# Patient Record
Sex: Female | Born: 1940 | Race: White | Hispanic: No | Marital: Married | Smoking: Never smoker
Health system: Southern US, Community
[De-identification: ages and names within clinical notes are randomized; demographics above are authoritative.]

## PROBLEM LIST (undated history)

## (undated) DIAGNOSIS — C50919 Malignant neoplasm of unspecified site of unspecified female breast: Secondary | ICD-10-CM

## (undated) DIAGNOSIS — I10 Essential (primary) hypertension: Secondary | ICD-10-CM

## (undated) DIAGNOSIS — E119 Type 2 diabetes mellitus without complications: Secondary | ICD-10-CM

## (undated) DIAGNOSIS — I2699 Other pulmonary embolism without acute cor pulmonale: Secondary | ICD-10-CM

## (undated) DIAGNOSIS — E785 Hyperlipidemia, unspecified: Secondary | ICD-10-CM

## (undated) HISTORY — PX: ABDOMINAL HYSTERECTOMY: SHX81

## (undated) HISTORY — PX: BREAST LUMPECTOMY: SHX2

## (undated) HISTORY — PX: TONSILLECTOMY: SUR1361

## (undated) HISTORY — PX: BACK SURGERY: SHX140

---

## 1999-11-29 ENCOUNTER — Other Ambulatory Visit: Admission: RE | Admit: 1999-11-29 | Discharge: 1999-11-29 | Payer: Self-pay | Admitting: Obstetrics and Gynecology

## 2001-01-21 ENCOUNTER — Other Ambulatory Visit: Admission: RE | Admit: 2001-01-21 | Discharge: 2001-01-21 | Payer: Self-pay | Admitting: Obstetrics and Gynecology

## 2001-02-26 ENCOUNTER — Encounter: Payer: Self-pay | Admitting: Orthopedic Surgery

## 2001-03-04 ENCOUNTER — Ambulatory Visit (HOSPITAL_COMMUNITY): Admission: RE | Admit: 2001-03-04 | Discharge: 2001-03-04 | Payer: Self-pay | Admitting: Orthopedic Surgery

## 2002-03-17 ENCOUNTER — Other Ambulatory Visit: Admission: RE | Admit: 2002-03-17 | Discharge: 2002-03-17 | Payer: Self-pay | Admitting: Obstetrics and Gynecology

## 2005-01-13 ENCOUNTER — Encounter: Admission: RE | Admit: 2005-01-13 | Discharge: 2005-01-13 | Payer: Self-pay | Admitting: General Surgery

## 2005-01-20 ENCOUNTER — Encounter: Admission: RE | Admit: 2005-01-20 | Discharge: 2005-01-20 | Payer: Self-pay | Admitting: General Surgery

## 2005-01-22 ENCOUNTER — Ambulatory Visit (HOSPITAL_BASED_OUTPATIENT_CLINIC_OR_DEPARTMENT_OTHER): Admission: RE | Admit: 2005-01-22 | Discharge: 2005-01-22 | Payer: Self-pay | Admitting: General Surgery

## 2005-01-22 ENCOUNTER — Ambulatory Visit (HOSPITAL_COMMUNITY): Admission: RE | Admit: 2005-01-22 | Discharge: 2005-01-22 | Payer: Self-pay | Admitting: General Surgery

## 2005-01-22 ENCOUNTER — Encounter (INDEPENDENT_AMBULATORY_CARE_PROVIDER_SITE_OTHER): Payer: Self-pay | Admitting: *Deleted

## 2005-01-29 ENCOUNTER — Ambulatory Visit: Payer: Self-pay | Admitting: Oncology

## 2005-02-25 ENCOUNTER — Encounter: Admission: RE | Admit: 2005-02-25 | Discharge: 2005-02-25 | Payer: Self-pay | Admitting: General Surgery

## 2005-03-06 ENCOUNTER — Ambulatory Visit: Admission: RE | Admit: 2005-03-06 | Discharge: 2005-05-16 | Payer: Self-pay | Admitting: Radiation Oncology

## 2005-04-29 ENCOUNTER — Ambulatory Visit: Payer: Self-pay | Admitting: Oncology

## 2005-08-12 ENCOUNTER — Ambulatory Visit: Payer: Self-pay | Admitting: Oncology

## 2005-11-10 ENCOUNTER — Ambulatory Visit: Payer: Self-pay | Admitting: Oncology

## 2006-08-21 ENCOUNTER — Ambulatory Visit: Payer: Self-pay | Admitting: Oncology

## 2006-08-25 LAB — COMPREHENSIVE METABOLIC PANEL
ALT: 13 U/L (ref 0–40)
AST: 16 U/L (ref 0–37)
Albumin: 4.2 g/dL (ref 3.5–5.2)
CO2: 30 mEq/L (ref 19–32)
Calcium: 9.4 mg/dL (ref 8.4–10.5)
Chloride: 103 mEq/L (ref 96–112)
Potassium: 3.7 mEq/L (ref 3.5–5.3)
Total Protein: 6.6 g/dL (ref 6.0–8.3)

## 2006-08-25 LAB — CBC WITH DIFFERENTIAL/PLATELET
BASO%: 0.5 % (ref 0.0–2.0)
Eosinophils Absolute: 0.1 10*3/uL (ref 0.0–0.5)
HCT: 43 % (ref 34.8–46.6)
MCHC: 34.5 g/dL (ref 32.0–36.0)
MONO#: 0.4 10*3/uL (ref 0.1–0.9)
NEUT#: 3.6 10*3/uL (ref 1.5–6.5)
RBC: 4.85 10*6/uL (ref 3.70–5.32)
WBC: 5.2 10*3/uL (ref 3.9–10.0)
lymph#: 1.1 10*3/uL (ref 0.9–3.3)

## 2006-08-25 LAB — CANCER ANTIGEN 27.29: CA 27.29: 16 U/mL (ref 0–39)

## 2007-07-09 ENCOUNTER — Ambulatory Visit: Payer: Self-pay | Admitting: Oncology

## 2007-09-10 ENCOUNTER — Ambulatory Visit: Payer: Self-pay | Admitting: Oncology

## 2007-09-14 LAB — CBC WITH DIFFERENTIAL/PLATELET
BASO%: 0.4 % (ref 0.0–2.0)
EOS%: 1 % (ref 0.0–7.0)
MCH: 30.3 pg (ref 26.0–34.0)
MCHC: 35.1 g/dL (ref 32.0–36.0)
NEUT%: 62.8 % (ref 39.6–76.8)
RBC: 4.94 10*6/uL (ref 3.70–5.32)
RDW: 13.4 % (ref 11.3–14.5)
lymph#: 1.5 10*3/uL (ref 0.9–3.3)

## 2007-09-14 LAB — COMPREHENSIVE METABOLIC PANEL
ALT: 19 U/L (ref 0–35)
AST: 18 U/L (ref 0–37)
Calcium: 9.9 mg/dL (ref 8.4–10.5)
Chloride: 102 mEq/L (ref 96–112)
Creatinine, Ser: 0.85 mg/dL (ref 0.40–1.20)
Potassium: 4.4 mEq/L (ref 3.5–5.3)
Sodium: 143 mEq/L (ref 135–145)

## 2008-04-26 ENCOUNTER — Encounter: Admission: RE | Admit: 2008-04-26 | Discharge: 2008-04-26 | Payer: Self-pay | Admitting: General Surgery

## 2008-06-13 ENCOUNTER — Inpatient Hospital Stay (HOSPITAL_COMMUNITY): Admission: RE | Admit: 2008-06-13 | Discharge: 2008-06-17 | Payer: Self-pay | Admitting: Neurosurgery

## 2008-09-06 ENCOUNTER — Encounter: Admission: RE | Admit: 2008-09-06 | Discharge: 2008-09-06 | Payer: Self-pay | Admitting: Neurosurgery

## 2008-09-15 ENCOUNTER — Ambulatory Visit: Payer: Self-pay | Admitting: Oncology

## 2008-09-19 LAB — COMPREHENSIVE METABOLIC PANEL
Albumin: 3.8 g/dL (ref 3.5–5.2)
Alkaline Phosphatase: 72 U/L (ref 39–117)
BUN: 12 mg/dL (ref 6–23)
CO2: 31 mEq/L (ref 19–32)
Glucose, Bld: 120 mg/dL — ABNORMAL HIGH (ref 70–99)
Sodium: 142 mEq/L (ref 135–145)
Total Bilirubin: 0.5 mg/dL (ref 0.3–1.2)
Total Protein: 7 g/dL (ref 6.0–8.3)

## 2008-09-19 LAB — CBC WITH DIFFERENTIAL/PLATELET
Basophils Absolute: 0 10*3/uL (ref 0.0–0.1)
Eosinophils Absolute: 0.1 10*3/uL (ref 0.0–0.5)
HCT: 43.6 % (ref 34.8–46.6)
HGB: 14.9 g/dL (ref 11.6–15.9)
LYMPH%: 16.8 % (ref 14.0–48.0)
MCV: 86.7 fL (ref 81.0–101.0)
MONO#: 0.5 10*3/uL (ref 0.1–0.9)
MONO%: 7.4 % (ref 0.0–13.0)
NEUT#: 4.9 10*3/uL (ref 1.5–6.5)
NEUT%: 74.5 % (ref 39.6–76.8)
Platelets: 233 10*3/uL (ref 145–400)
RBC: 5.03 10*6/uL (ref 3.70–5.32)
WBC: 6.6 10*3/uL (ref 3.9–10.0)

## 2008-09-19 LAB — CANCER ANTIGEN 27.29: CA 27.29: 25 U/mL (ref 0–39)

## 2009-02-08 ENCOUNTER — Ambulatory Visit: Payer: Self-pay | Admitting: Radiology

## 2009-02-08 ENCOUNTER — Ambulatory Visit (HOSPITAL_BASED_OUTPATIENT_CLINIC_OR_DEPARTMENT_OTHER): Admission: RE | Admit: 2009-02-08 | Discharge: 2009-02-08 | Payer: Self-pay | Admitting: Family Medicine

## 2009-06-28 ENCOUNTER — Encounter: Admission: RE | Admit: 2009-06-28 | Discharge: 2009-06-28 | Payer: Self-pay | Admitting: Neurosurgery

## 2009-09-10 ENCOUNTER — Ambulatory Visit: Payer: Self-pay | Admitting: Oncology

## 2009-09-12 LAB — COMPREHENSIVE METABOLIC PANEL
Alkaline Phosphatase: 84 U/L (ref 39–117)
BUN: 18 mg/dL (ref 6–23)
CO2: 28 mEq/L (ref 19–32)
Creatinine, Ser: 1 mg/dL (ref 0.40–1.20)
Glucose, Bld: 71 mg/dL (ref 70–99)
Sodium: 140 mEq/L (ref 135–145)
Total Bilirubin: 0.3 mg/dL (ref 0.3–1.2)

## 2009-09-12 LAB — CBC WITH DIFFERENTIAL/PLATELET
Eosinophils Absolute: 0.1 10*3/uL (ref 0.0–0.5)
HCT: 43.6 % (ref 34.8–46.6)
LYMPH%: 20 % (ref 14.0–49.7)
MCV: 88.9 fL (ref 79.5–101.0)
MONO#: 0.6 10*3/uL (ref 0.1–0.9)
MONO%: 9.6 % (ref 0.0–14.0)
NEUT#: 4 10*3/uL (ref 1.5–6.5)
NEUT%: 68.1 % (ref 38.4–76.8)
Platelets: 214 10*3/uL (ref 145–400)
RBC: 4.91 10*6/uL (ref 3.70–5.45)
WBC: 5.9 10*3/uL (ref 3.9–10.3)

## 2010-10-01 ENCOUNTER — Ambulatory Visit: Payer: Self-pay | Admitting: Oncology

## 2010-10-03 LAB — CBC WITH DIFFERENTIAL/PLATELET
EOS%: 4.4 % (ref 0.0–7.0)
LYMPH%: 22.9 % (ref 14.0–49.7)
MCH: 29.9 pg (ref 25.1–34.0)
MCHC: 34.1 g/dL (ref 31.5–36.0)
MCV: 87.6 fL (ref 79.5–101.0)
MONO%: 8.6 % (ref 0.0–14.0)
Platelets: 240 10*3/uL (ref 145–400)
RBC: 4.95 10*6/uL (ref 3.70–5.45)
RDW: 14.1 % (ref 11.2–14.5)

## 2010-10-04 LAB — COMPREHENSIVE METABOLIC PANEL
AST: 21 U/L (ref 0–37)
Albumin: 4 g/dL (ref 3.5–5.2)
Alkaline Phosphatase: 86 U/L (ref 39–117)
BUN: 18 mg/dL (ref 6–23)
Potassium: 4.2 mEq/L (ref 3.5–5.3)
Sodium: 138 mEq/L (ref 135–145)
Total Bilirubin: 0.5 mg/dL (ref 0.3–1.2)
Total Protein: 6.9 g/dL (ref 6.0–8.3)

## 2011-03-18 NOTE — Op Note (Signed)
NAME:  Denise Salazar, Denise Salazar NO.:  [id number]   MEDICAL RECORD NO.:  [id number]          PATIENT TYPE:  INP   LOCATION:  3028                         FACILITY:  [facility]   PHYSICIAN:  Reinaldo Meeker, M.D. DATE OF BIRTH:  1941-10-30   DATE OF PROCEDURE:  DATE OF DISCHARGE:                               OPERATIVE REPORT   PREOPERATIVE DIAGNOSIS:  Herniated disk L4-5 central and bilateral.   POSTOPERATIVE DIAGNOSIS:  Herniated disk L4-5 central and bilateral.   PROCEDURE:  Bilateral L4-5 interlaminar laminotomy for excision of  herniated disk with operative microscope followed by interspinous fusion  with invasive bony spacer followed by nonsegmental instrumentation L4-5  with the spinous process clamp.   SURGEON:  Reinaldo Meeker, MD   ASSISTANT:  Kathaleen Maser. Pool, MD   PROCEDURE IN DETAIL:  After being placed in the prone position, the  patient's back was prepped and draped in the usual sterile fashion.  Localizing x-rays were taken prior to incision to identify the  appropriate level.  Midline incision was made over the spinous process  of L4-L5.  Using Bovie cutting current, the incision was carried on the  spinous processes.  Subperiosteal dissection was then carried out  bilaterally on the spinous processes and lamina.  Self-retaining  retractor was placed for exposure.  X-ray showed approach to the  appropriate level.  Starting on the patient's left side, laminotomy was  performed by removing the inferior one-half of the L4 lamina, medial one-  third of the facet joint, the superior one-half of L5 lamina.  Residual  bone was removed and saved for use later in the case.  Ligamentum flavum  was removed in a piecemeal fashion.  Similar procedure was then carried  out on the patient's right side.  Once again, we removed the inferior  half of the L4 lamina, the medial one-third of the facet joint, and the  superior one-half of L5 lamina.  Once again, residual bone was  removed  and saved for use later in the case.  Ligamentum flavum was removed in a  piecemeal fashion.  Interspinous ligament and ligamentum flavum between  the spinous processes of L4-L5 was also removed in preparation for the  interspinous fusion.  This time, microscope was draped, brought into the  field, and used for the remainder of the case.  Starting on the  patient's left side, microdiskectomy was carried out.  Large amounts of  herniated disk material including 2 large free fragments were identified  and removed.  This gave excellent decompression of the underlying dura  and nerve root.  Similar diskectomy was then carried on the opposite  side until the disk space was well cleaned out bilaterally.  At this  time, posterior fusion was performed by measuring the template up to a  29-FA size.  A 14-mm bony spacer was placed between the spinous  processes of L4 and L5 and felt to be in good position.  A mixture of  OsteoSet Plus and autologous bone was then placed on top of that and  then a 45-mm interspinous clamp was placed without difficulty  for  posterior instrumentation.  This time, large amounts of irrigation was  carried out.  More OsteoSet autologous bone were placed in between the  clamp overlying the spinous processes of L4-L5.  Final fluoroscopy in AP  and lateral direction showed everything to be in excellent position.  Hemovac drain was then left in the epidural space  and brought through a separate stab incision.  The wound was then closed  in multiple layers of Vicryl in the muscle fascia, subcutaneous, and  subcuticular tissues, and staples were placed on the skin.  A sterile  dressing was then applied.  The patient was extubated and taken to  recovery room in stable condition.           ______________________________  Reinaldo Meeker, M.D.     ROK/MEDQ  D:  06/16/2008  T:  06/17/2008  Job:  [id number]

## 2011-03-21 NOTE — Discharge Summary (Signed)
NAME:  KIEREN, RICCI NO.:  [id number]   MEDICAL RECORD NO.:  [id number]          PATIENT TYPE:  INP   LOCATION:  3028                         FACILITY:  [facility]   PHYSICIAN:  Reinaldo Meeker, M.D. DATE OF BIRTH:  10/30/1941   DATE OF ADMISSION:  06/13/2008  DATE OF DISCHARGE:  06/17/2008                               DISCHARGE SUMMARY   PRIMARY DIAGNOSIS:  Herniated disc at L4-L5.   SECONDARY DIAGNOSES:  History of deep vein thrombosis with pulmonary  embolism.   PROCEDURE:  L4-L5 diskectomy with interspinous fusion and spinous  process plating.   HISTORY:  Ms. Rhee is a 70 year old female with a large central  herniated disk, L4-L5 requiring surgery.  She had a DVT and a pulmonary  embolism in May of this year and is on Coumadin.  Coumadin was stopped 4  days prior to admission.  She was admitted for heparinization until the  time of her surgery.   HOSPITAL COURSE:  Ms. Camposano was admitted on June 13, 2008, and was  started on heparin.  This was kept going until midnight before her  surgery on June 16, 2008.  On June 16, 2008, she was taken to the  operating room, and she underwent the bilateral L4-L5 diskectomy with  interspinous fusion and interspinous plate.  She did extremely well with  surgery and worked with marked relief of her leg pain.  By the next day,  she was up ambulating well tolerating regular diet.  The plan was to  restart her Coumadin in 2 days' time.  Her condition was markedly  improved versus admission.  She was discharged to home with pain  medications only.           ______________________________  Reinaldo Meeker, M.D.     ROK/MEDQ  D:  07/27/2008  T:  07/28/2008  Job:  [id number]

## 2011-08-01 LAB — CBC
HCT: 39.9
HCT: 40.4
Hemoglobin: 13.7
Hemoglobin: 13.7
Hemoglobin: 13.8
MCHC: 34
MCHC: 34.1
MCHC: 34.3
MCV: 87.4
Platelets: 221
Platelets: 238
RDW: 14.4
RDW: 14.5
RDW: 14.7
RDW: 14.7
WBC: 5.6

## 2011-08-01 LAB — APTT: aPTT: 28

## 2011-08-01 LAB — HEPARIN LEVEL (UNFRACTIONATED)
Heparin Unfractionated: 0.48
Heparin Unfractionated: 0.77 — ABNORMAL HIGH
Heparin Unfractionated: 0.91 — ABNORMAL HIGH
Heparin Unfractionated: <0.1 — ABNORMAL LOW

## 2011-08-01 LAB — PROTIME-INR
INR: 1
Prothrombin Time: 13.2

## 2011-08-01 LAB — BASIC METABOLIC PANEL
CO2: 29
Calcium: 8.8
Glucose, Bld: 138 — ABNORMAL HIGH
Sodium: 140

## 2014-03-06 ENCOUNTER — Telehealth (INDEPENDENT_AMBULATORY_CARE_PROVIDER_SITE_OTHER): Payer: Self-pay

## 2014-03-06 NOTE — Telephone Encounter (Signed)
I returned pts call. Pt states she has cyst on her finger and was not sure if our office did this type of procedure. Pt advised she needs to see a Copy. Pt states she has ortho hand specialist with the ortho md she sees. Pt states she will call their office for appt.

## 2014-03-06 NOTE — Telephone Encounter (Signed)
Message copied by Dois Davenport on Mon Mar 06, 2014  3:51 PM ------      Message from: Joya San      Created: Mon Mar 06, 2014  3:35 PM      Contact: [phone]                   ----- Message -----         From: Jeanann Lewandowsky, CMA         Sent: 03/06/2014   3:27 PM           To: Joya San            Please send to [facility]!      ----- Message -----         From: Joya San         Sent: 03/06/2014   1:54 PM           To: Jeanann Lewandowsky, CMA            Pt has a question concerning a cyst she found. Please advise       ------

## 2021-03-01 ENCOUNTER — Other Ambulatory Visit: Payer: Self-pay

## 2021-03-01 ENCOUNTER — Encounter: Payer: Self-pay | Admitting: Podiatry

## 2021-03-01 ENCOUNTER — Ambulatory Visit (INDEPENDENT_AMBULATORY_CARE_PROVIDER_SITE_OTHER): Payer: MEDICARE | Admitting: Podiatry

## 2021-03-01 DIAGNOSIS — B351 Tinea unguium: Secondary | ICD-10-CM | POA: Diagnosis not present

## 2021-03-01 DIAGNOSIS — M79675 Pain in left toe(s): Secondary | ICD-10-CM | POA: Diagnosis not present

## 2021-03-01 DIAGNOSIS — D689 Coagulation defect, unspecified: Secondary | ICD-10-CM | POA: Insufficient documentation

## 2021-03-01 DIAGNOSIS — E1159 Type 2 diabetes mellitus with other circulatory complications: Secondary | ICD-10-CM | POA: Diagnosis not present

## 2021-03-01 DIAGNOSIS — E119 Type 2 diabetes mellitus without complications: Secondary | ICD-10-CM | POA: Diagnosis not present

## 2021-03-01 DIAGNOSIS — M79674 Pain in right toe(s): Secondary | ICD-10-CM | POA: Diagnosis not present

## 2021-03-01 NOTE — Progress Notes (Signed)
This patient presents to the office with chief complaint of long thick nails and diabetic feet.  This patient  says there  is  no pain and discomfort in their feet.  This patient says there are long thick painful nails.  These nails are painful walking and wearing shoes.  Patient has no history of infection or drainage from both feet.  Patient is unable to  self treat his own nails . This patient presents  to the office today for treatment of the  long nails and a foot evaluation due to history of  diabetes.  General Appearance  Alert, conversant and in no acute stress.  Vascular  Dorsalis pedis and posterior tibial  pulses are not  palpable  bilaterally.  Capillary return is within normal limits  bilaterally. Temperature is within normal limits  bilaterally.  Neurologic  Senn-Weinstein monofilament wire test within normal limits  bilaterally. Muscle power within normal limits bilaterally.  Nails Thick disfigured discolored nails with subungual debris  from hallux to fifth toes bilaterally. No evidence of bacterial infection or drainage bilaterally.  Orthopedic  No limitations of motion of motion feet .  No crepitus or effusions noted.  No bony pathology or digital deformities noted.  Skin  normotropic skin with no porokeratosis noted bilaterally.  No signs of infections or ulcers noted.     Onychomycosis  Diabetes with vascular pathology.  IE  Debride nails x 10.  A diabetic foot exam was performed and there is no evidence of   neurologic pathology but she has vascular pathology.Marland Kitchen   RTC 3 months.   Gardiner Barefoot DPM

## 2021-05-29 ENCOUNTER — Ambulatory Visit (INDEPENDENT_AMBULATORY_CARE_PROVIDER_SITE_OTHER): Payer: MEDICARE | Admitting: Podiatry

## 2021-05-29 ENCOUNTER — Encounter: Payer: Self-pay | Admitting: Podiatry

## 2021-05-29 ENCOUNTER — Other Ambulatory Visit: Payer: Self-pay

## 2021-05-29 DIAGNOSIS — D689 Coagulation defect, unspecified: Secondary | ICD-10-CM

## 2021-05-29 DIAGNOSIS — B351 Tinea unguium: Secondary | ICD-10-CM | POA: Diagnosis not present

## 2021-05-29 DIAGNOSIS — M79675 Pain in left toe(s): Secondary | ICD-10-CM | POA: Diagnosis not present

## 2021-05-29 DIAGNOSIS — M79674 Pain in right toe(s): Secondary | ICD-10-CM

## 2021-05-29 DIAGNOSIS — E1159 Type 2 diabetes mellitus with other circulatory complications: Secondary | ICD-10-CM

## 2021-05-29 NOTE — Progress Notes (Signed)
This patient returns to my office for at risk foot care.  This patient requires this care by a professional since this patient will be at risk due to having diabetes and coagulation defect.    This patient is unable to cut nails herself since the patient cannot reach her nails.These nails are painful walking and wearing shoes.  This patient presents for at risk foot care today.  General Appearance  Alert, conversant and in no acute stress.  Vascular  Dorsalis pedis and posterior tibial  pulses are  not palpable  bilaterally.  Capillary return is within normal limits  bilaterally. Temperature is within normal limits  bilaterally.  Neurologic  Senn-Weinstein monofilament wire test within normal limits  bilaterally. Muscle power within normal limits bilaterally.  Nails Thick disfigured discolored nails with subungual debris  from hallux to fifth toes bilaterally. No evidence of bacterial infection or drainage bilaterally.  Orthopedic  No limitations of motion  feet .  No crepitus or effusions noted.  No bony pathology or digital deformities noted.  Skin  normotropic skin with no porokeratosis noted bilaterally.  No signs of infections or ulcers noted.     Onychomycosis  Pain in right toes  Pain in left toes  Consent was obtained for treatment procedures.   Mechanical debridement of nails 1-5  bilaterally performed with a nail nipper.  Filed with dremel without incident.    Return office visit    3 months                  Told patient to return for periodic foot care and evaluation due to potential at risk complications.   Gardiner Barefoot DPM

## 2021-07-12 ENCOUNTER — Emergency Department (HOSPITAL_BASED_OUTPATIENT_CLINIC_OR_DEPARTMENT_OTHER): Payer: MEDICARE

## 2021-07-12 ENCOUNTER — Emergency Department (HOSPITAL_BASED_OUTPATIENT_CLINIC_OR_DEPARTMENT_OTHER)
Admission: EM | Admit: 2021-07-12 | Discharge: 2021-07-12 | Disposition: A | Discharge status: Home / Self Care | Payer: MEDICARE | Attending: Emergency Medicine | Admitting: Emergency Medicine

## 2021-07-12 ENCOUNTER — Other Ambulatory Visit: Payer: Self-pay

## 2021-07-12 ENCOUNTER — Encounter (HOSPITAL_BASED_OUTPATIENT_CLINIC_OR_DEPARTMENT_OTHER): Payer: Self-pay | Admitting: *Deleted

## 2021-07-12 DIAGNOSIS — Z79899 Other long term (current) drug therapy: Secondary | ICD-10-CM | POA: Insufficient documentation

## 2021-07-12 DIAGNOSIS — I10 Essential (primary) hypertension: Secondary | ICD-10-CM | POA: Diagnosis not present

## 2021-07-12 DIAGNOSIS — Z853 Personal history of malignant neoplasm of breast: Secondary | ICD-10-CM | POA: Insufficient documentation

## 2021-07-12 DIAGNOSIS — R5383 Other fatigue: Secondary | ICD-10-CM | POA: Insufficient documentation

## 2021-07-12 DIAGNOSIS — R2243 Localized swelling, mass and lump, lower limb, bilateral: Secondary | ICD-10-CM | POA: Diagnosis not present

## 2021-07-12 DIAGNOSIS — Z9010 Acquired absence of unspecified breast and nipple: Secondary | ICD-10-CM | POA: Insufficient documentation

## 2021-07-12 DIAGNOSIS — Z7902 Long term (current) use of antithrombotics/antiplatelets: Secondary | ICD-10-CM | POA: Diagnosis not present

## 2021-07-12 DIAGNOSIS — E119 Type 2 diabetes mellitus without complications: Secondary | ICD-10-CM | POA: Diagnosis not present

## 2021-07-12 DIAGNOSIS — R911 Solitary pulmonary nodule: Secondary | ICD-10-CM | POA: Insufficient documentation

## 2021-07-12 DIAGNOSIS — R609 Edema, unspecified: Secondary | ICD-10-CM

## 2021-07-12 DIAGNOSIS — R0602 Shortness of breath: Secondary | ICD-10-CM | POA: Diagnosis not present

## 2021-07-12 HISTORY — DX: Type 2 diabetes mellitus without complications: E11.9

## 2021-07-12 HISTORY — DX: Other pulmonary embolism without acute cor pulmonale: I26.99

## 2021-07-12 HISTORY — DX: Essential (primary) hypertension: I10

## 2021-07-12 HISTORY — DX: Malignant neoplasm of unspecified site of unspecified female breast: C50.919

## 2021-07-12 HISTORY — DX: Hyperlipidemia, unspecified: E78.5

## 2021-07-12 LAB — PROTIME-INR
INR: 1.8 — ABNORMAL HIGH (ref 0.8–1.2)
Prothrombin Time: 20.5 seconds — ABNORMAL HIGH (ref 11.4–15.2)

## 2021-07-12 MED ORDER — IOHEXOL 350 MG/ML SOLN
100.0000 mL | Freq: Once | INTRAVENOUS | Status: AC | PRN
  Administered 2021-07-12: 80 mL via INTRAVENOUS

## 2021-07-12 NOTE — ED Provider Notes (Signed)
[facility] Provider Note   CSN: [id number] Arrival date & time: 07/12/21  1508     History Chief Complaint  Patient presents with   Elevated dimer    Denise Salazar is a 80 y.o. female with a past medical history of breast cancer, hypertension, prior PE and DVT, currently on Coumadin presenting to the ED with leg swelling, shortness of breath and fatigue.  Symptoms have been going on for the past several months.  She was evaluated by her PCP for the symptoms as well as for an annual physical.  Blood work was obtained she was found to have an elevated D-dimer so she was sent to the ER for a CT angio to rule out a PE.  She last was diagnosed with PE/DVT over 10 years ago.  At that time she was under treatment for breast cancer.  She takes Coumadin on a daily basis and has not missed any doses.  Denies any hemoptysis, chest pain.  She reports bilateral lower extremity edema that improves overnight when she elevates her leg.  The left leg is slightly worse than the right.  Denies any abdominal pain, cough or fever.  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     No family history on file.  Social History   Tobacco Use   Smoking status: Never   Smokeless tobacco: Never  Substance Use Topics   Alcohol use: Not Currently   Drug use: Not Currently    Home Medications Prior to Admission medications   Medication  Sig Start Date End Date Taking? Authorizing Provider  amLODipine (NORVASC) 2.5 MG tablet Take 1 tablet by mouth daily. 06/06/21  Yes [provider]  bupivacaine (MARCAINE) 0.25 % injection Inject into the skin. 04/23/21  Yes [provider]  denosumab (PROLIA) 60 MG/ML SOSY injection Inject into the skin. 01/03/20  Yes [provider]  escitalopram (LEXAPRO) 10 MG tablet Take 1 tablet by mouth daily. 07/02/21 09/30/21 Yes [provider]  furosemide (LASIX) 20 MG tablet Take by mouth. 03/26/20  Yes [provider]  potassium chloride SA (KLOR-CON) 20 MEQ tablet TAKE 1 TABLET(20 MEQ) BY MOUTH DAILY 05/20/21  Yes [provider]  warfarin (COUMADIN) 5 MG tablet TAKE 1 TABLET BY MOUTH  DAILY EXCEPT TAKE 1 AND 1/2 TABLETS ON FRIDAYS OR AS  DIRECTED 06/26/21  Yes [provider]  calcium carbonate (OSCAL) 1500 (600 Ca) MG TABS tablet Take by mouth.    [provider]  Multiple Vitamin (MULTI-VITAMIN) tablet Take 1 tablet by mouth daily.    [provider]  pravastatin (PRAVACHOL) 10 MG tablet Take 10 mg by mouth at bedtime. 05/12/21   [provider]  Vitamin D, Ergocalciferol, (DRISDOL) 1.25 MG (50000 UNIT) CAPS capsule Take by mouth. 06/12/21  [provider]    Allergies    No allergies on file  Review of Systems   Review of Systems  Constitutional:  Positive for fatigue. Negative for appetite change, chills and fever.  HENT:  Negative for ear pain, rhinorrhea, sneezing and sore throat.   Eyes:  Negative for photophobia and visual disturbance.  Respiratory:  Positive for shortness of breath. Negative for cough, chest tightness and wheezing.   Cardiovascular:  Positive for leg swelling. Negative for chest pain and palpitations.  Gastrointestinal:  Negative for abdominal pain, blood in stool, constipation, diarrhea, nausea and vomiting.  Genitourinary:  Negative for dysuria, hematuria and urgency.   Musculoskeletal:  Negative for myalgias.  Skin:  Negative for rash.  Neurological:  Negative for dizziness, weakness and light-headedness.   Physical Exam Updated Vital Signs BP (!) 201/70   Pulse (!) 59   Temp 98.4 F (36.9 C) (Oral)   Resp 16   Ht '5\' 4"'$  (1.626 m)   Wt 86.2 kg   SpO2 98%   BMI 32.61 kg/m   Physical Exam Vitals and nursing note reviewed.  Constitutional:      General: She is not in acute distress.    Appearance: She is well-developed.  HENT:     Head: Normocephalic and atraumatic.     Nose: Nose normal.  Eyes:     General: No scleral icterus.       Left eye: No discharge.     Conjunctiva/sclera: Conjunctivae normal.  Cardiovascular:     Rate and Rhythm: Normal rate and regular rhythm.     Heart sounds: Normal heart sounds. No murmur heard.   No friction rub. No gallop.  Pulmonary:     Effort: Pulmonary effort is normal. No respiratory distress.     Breath sounds: Normal breath sounds.  Abdominal:     General: Bowel sounds are normal. There is no distension.     Palpations: Abdomen is soft.     Tenderness: There is no abdominal tenderness. There is no guarding.  Musculoskeletal:        General: Normal range of motion.     Cervical back: Normal range of motion and neck supple.     Comments: Bilateral lower extremity edema.  2+ DP pulse palpated.  No erythema or calf tenderness bilaterally.  Skin:    General: Skin is warm and dry.     Findings: No rash.  Neurological:     Mental Status: She is alert.     Motor: No abnormal muscle tone.     Coordination: Coordination normal.    ED Results / Procedures / Treatments   Labs (all labs ordered are listed, but only abnormal results are displayed) Labs Reviewed  PROTIME-INR - Abnormal; Notable for the following components:      Result Value   Prothrombin Time 20.5 (*)    INR 1.8 (*)    All other components within normal limits    EKG None  Radiology CT Angio Chest PE W/Cm &/Or Wo  Cm  Result Date: 07/12/2021 CLINICAL DATA:  Concern for pulmonary embolism. EXAM: CT ANGIOGRAPHY CHEST WITH CONTRAST TECHNIQUE: Multidetector CT imaging of the chest was performed using the standard protocol during bolus administration of intravenous contrast. Multiplanar CT image reconstructions and MIPs were obtained to evaluate the vascular anatomy. CONTRAST:  3m OMNIPAQUE IOHEXOL 350 MG/ML SOLN COMPARISON:  Chest radiograph dated 02/08/2009. FINDINGS: Cardiovascular: There is no cardiomegaly or pericardial effusion. There is coronary vascular calcification. Moderate atherosclerotic calcification of  the thoracic aorta. No aneurysmal dilatation or dissection. The origins of the great vessels of the aortic arch appear patent as visualized. Evaluation of the pulmonary arteries is somewhat limited due to respiratory motion. No pulmonary artery embolus identified. Mediastinum/Nodes: No hilar or mediastinal adenopathy. The esophagus and the thyroid gland are grossly unremarkable as visualized. No mediastinal fluid collection. Lungs/Pleura: There is a 7 mm right upper lobe nodule (71/6). There are bibasilar linear atelectasis/scarring. No focal consolidation, pleural effusion, or pneumothorax. The central airways are patent. Upper Abdomen: Gallstone. Musculoskeletal: Osteopenia with degenerative changes of the spine. No acute osseous pathology. Review of the MIP images confirms the above findings. IMPRESSION: 1. No acute intrathoracic pathology. No CT evidence of pulmonary embolism. 2. A 7 mm right upper lobe nodule. Non-contrast chest CT at 6-12 months is recommended. If the nodule is stable at time of repeat CT, then future CT at 18-24 months (from today's scan) is considered optional for low-risk patients, but is recommended for high-risk patients. This recommendation follows the consensus statement: Guidelines for Management of Incidental Pulmonary Nodules Detected on CT Images: From the Fleischner Society 2017;  Radiology 2017; 284:228-243. 3. Cholelithiasis. 4. Aortic Atherosclerosis (ICD10-I70.0). Electronically Signed   By: Anner Crete M.D.   On: 07/12/2021 20:06   US Venous Img Lower Bilateral  Result Date: 07/12/2021 CLINICAL DATA:  Leg swelling with positive D-dimer. EXAM: Bilateral LOWER EXTREMITY VENOUS DOPPLER ULTRASOUND TECHNIQUE: Gray-scale sonography with compression, as well as color and duplex ultrasound, were performed to evaluate the deep venous system(s) from the level of the common femoral vein through the popliteal and proximal calf veins. COMPARISON:  CT angiogram chest 07/12/2021. FINDINGS: VENOUS Normal compressibility of the common femoral, superficial femoral, and popliteal veins. visualized portions of profunda femoral vein and great saphenous vein unremarkable. The bilateral posterior tibial and peroneal veins are not well visualized. No filling defects to suggest DVT on grayscale or color Doppler imaging. Doppler waveforms show normal direction of venous flow, normal respiratory plasticity and response to augmentation. OTHER None. Limitations: none IMPRESSION: No femoropopliteal DVT nor evidence of DVT within the visualized lower extremity veins. Note is made that the bilateral posterior tibial and peroneal veins are not well visualized. If clinical symptoms are inconsistent or if there are persistent or worsening symptoms, further imaging (possibly involving the iliac veins) may be warranted. Electronically Signed   By: Ronney Asters M.D.   On: 07/12/2021 20:25    Procedures Procedures   Medications Ordered in ED Medications  iohexol (OMNIPAQUE) 350 MG/ML injection 100 mL (80 mLs Intravenous Contrast Given 07/12/21 1924)    ED Course  I have reviewed the triage vital signs and the nursing notes.  Pertinent labs & imaging results that were available during my care of the patient were reviewed by me and considered in my medical decision making (see chart for details).  Clinical  Course as of 07/12/21 2235  Fri Jul 12, 2021  2234 160/80 BP reported when checked manually. [HK]    Clinical Course User Index [HK] Delia Heady, PA-C   MDM Rules/Calculators/A&P                           80 year old female presenting to the ED from PCPs office for CT angio of her chest.  Has been feeling short of breath, fatigue with bilateral lower extremity edema over the past few months.  PCP did blood work today and she was found to have an elevated  D-dimer.  She was sent to the ER for further evaluation.  She noticed that her bilateral lower extremity edema improves with elevation at night and throughout the day when she ambulates it gets worse.  The left is slightly worse than the right.  She denies any chest pain, hemoptysis, abdominal pain or fever.  On exam lungs are clear bilaterally.  She has bilateral swelling in both of her ankles with equal and intact distal pulses bilaterally.  Vital signs within normal limits.  Will obtain imaging and reassess.  CT angio of the chest is negative for PE but does show a right-sided lung nodule which I informed her about.  Ultrasound is negative for DVT bilaterally.  Suspect this is peripheral edema.  No signs of edema noted on chest imaging.  Her INR is 1.8 here so we will have her follow-up with her PCP regarding this.  Hypertensive here but with manual check she is 160/80.  Advised her to continue her antihypertensive as well as her other medications.  Patient is agreeable to the plan.  Will defer to PCP for further work-up of her ongoing fatigue and shortness of breath in an outpatient basis.  Return precautions given.   Patient is hemodynamically stable, in NAD, and able to ambulate in the ED. Evaluation does not show pathology that would require ongoing emergent intervention or inpatient treatment. I explained the diagnosis to the patient. Pain has been managed and has no complaints prior to discharge. Patient is comfortable with above plan and  is stable for discharge at this time. All questions were answered prior to disposition. Strict return precautions for returning to the ED were discussed. Encouraged follow up with PCP.   An After Visit Summary was printed and given to the patient.   Portions of this note were generated with [employer]. Dictation errors may occur despite best attempts at proofreading.  Final Clinical Impression(s) / ED Diagnoses Final diagnoses:  Peripheral edema  Lung nodule    Rx / DC Orders ED Discharge Orders     None        Delia Heady, PA-C 07/12/21 2235    Malvin Johns, MD 07/12/21 2319

## 2021-07-12 NOTE — ED Notes (Signed)
Patient transported to CT 

## 2021-07-12 NOTE — ED Notes (Signed)
ED Provider at bedside. 

## 2021-07-12 NOTE — ED Triage Notes (Signed)
C/o SOb and increased leg swelling x 1 month  , elevated dimer today with hx PE

## 2021-07-12 NOTE — Discharge Instructions (Addendum)
Continue elevating your legs help with swelling. Continue your home medications as previously prescribed. Your CT scan shows that you have a 7 mm right upper lobe lung nodule.  You will need to inform your primary care provider about this for appropriate surveillance. Have your primary care provider follow-up on your INR as it was 1.8 today. Return to the ER for chest pain, worsening swelling, coughing up blood or uncontrollable vomiting.

## 2021-07-12 NOTE — ED Notes (Signed)
Asked to manually check patients BP. Patient cuff inflated to 215m, checked BP X 3 with BP being 160/80. Physician notified of results.

## 2021-09-04 ENCOUNTER — Encounter: Payer: Self-pay | Admitting: Podiatry

## 2021-09-04 ENCOUNTER — Other Ambulatory Visit: Payer: Self-pay

## 2021-09-04 ENCOUNTER — Ambulatory Visit (INDEPENDENT_AMBULATORY_CARE_PROVIDER_SITE_OTHER): Payer: MEDICARE | Admitting: Podiatry

## 2021-09-04 DIAGNOSIS — B351 Tinea unguium: Secondary | ICD-10-CM

## 2021-09-04 DIAGNOSIS — D689 Coagulation defect, unspecified: Secondary | ICD-10-CM

## 2021-09-04 DIAGNOSIS — M79675 Pain in left toe(s): Secondary | ICD-10-CM

## 2021-09-04 DIAGNOSIS — L84 Corns and callosities: Secondary | ICD-10-CM

## 2021-09-04 DIAGNOSIS — M79674 Pain in right toe(s): Secondary | ICD-10-CM | POA: Diagnosis not present

## 2021-09-04 DIAGNOSIS — E1159 Type 2 diabetes mellitus with other circulatory complications: Secondary | ICD-10-CM

## 2021-09-04 NOTE — Progress Notes (Signed)
This patient returns to my office for at risk foot care.  This patient requires this care by a professional since this patient will be at risk due to having diabetes and coagulation defect.    This patient is unable to cut nails herself since the patient cannot reach her nails.These nails are painful walking and wearing shoes.  This patient presents for at risk foot care today.  General Appearance  Alert, conversant and in no acute stress.  Vascular  Dorsalis pedis and posterior tibial  pulses are  not palpable  bilaterally.  Capillary return is within normal limits  bilaterally. Temperature is within normal limits  bilaterally.  Neurologic  Senn-Weinstein monofilament wire test within normal limits  bilaterally. Muscle power within normal limits bilaterally.  Nails Thick disfigured discolored nails with subungual debris  from hallux to fifth toes bilaterally. No evidence of bacterial infection or drainage bilaterally.  Orthopedic  No limitations of motion  feet .  No crepitus or effusions noted.  HAV  right foot.  Skin  normotropic skin with porokeratosis  sub 1st MPJ right foot.noted bilaterally.  No signs of infections or ulcers noted.     Onychomycosis  Pain in right toes  Pain in left toes  Callus right foot.  Consent was obtained for treatment procedures.   Mechanical debridement of nails 1-5  bilaterally performed with a nail nipper.  Filed with dremel without incident.  Debride callus with # 15 blade followed by dremel tool usage.   Return office visit    10 weeks                  Told patient to return for periodic foot care and evaluation due to potential at risk complications.   Gardiner Barefoot DPM

## 2021-11-15 ENCOUNTER — Ambulatory Visit: Payer: MEDICARE | Admitting: Podiatry

## 2022-07-19 IMAGING — US US EXTREM LOW VENOUS
1 series · 13 of 24 positions shown · non-contrast
Comparison: CT angiogram chest 07/12/2021.

CLINICAL DATA: Leg swelling with positive D-dimer.

EXAM:
Bilateral LOWER EXTREMITY VENOUS DOPPLER ULTRASOUND
TECHNIQUE: Gray-scale sonography with compression, as well as color and duplex
ultrasound, were performed to evaluate the deep venous system(s)
from the level of the common femoral vein through the popliteal and
proximal calf veins.

[Series 1: us extrem low venous · 13 of 47 slices shown]
[im 1/47]
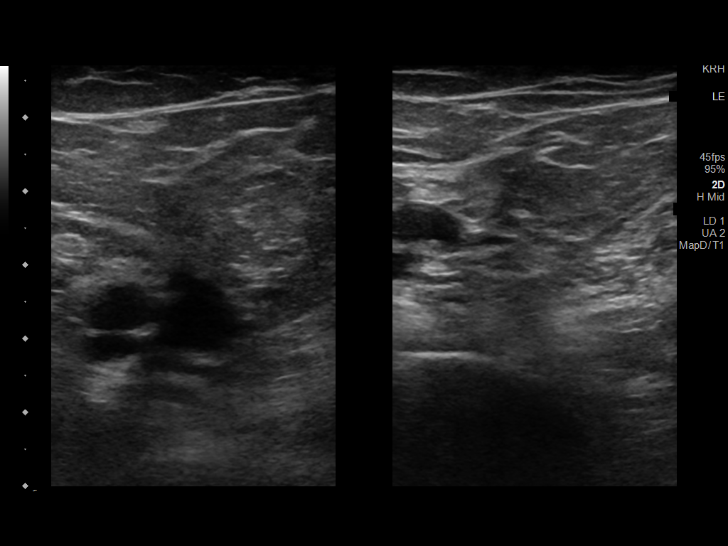
[im 5/47]
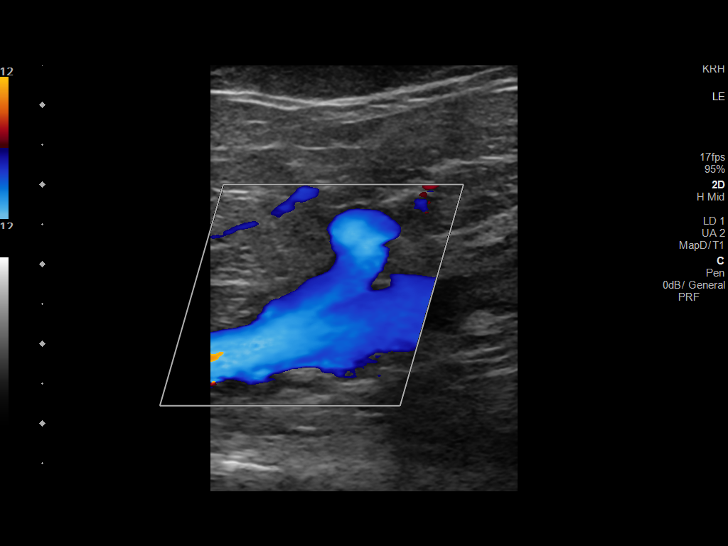
[im 9/47]
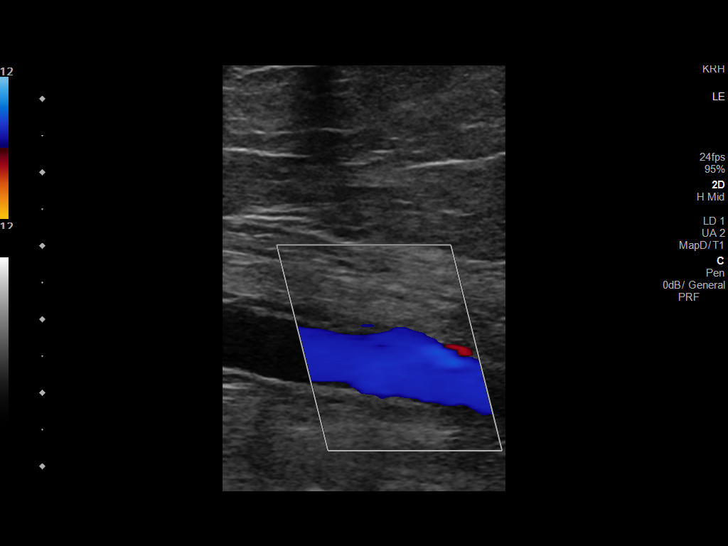
[im 13/47]
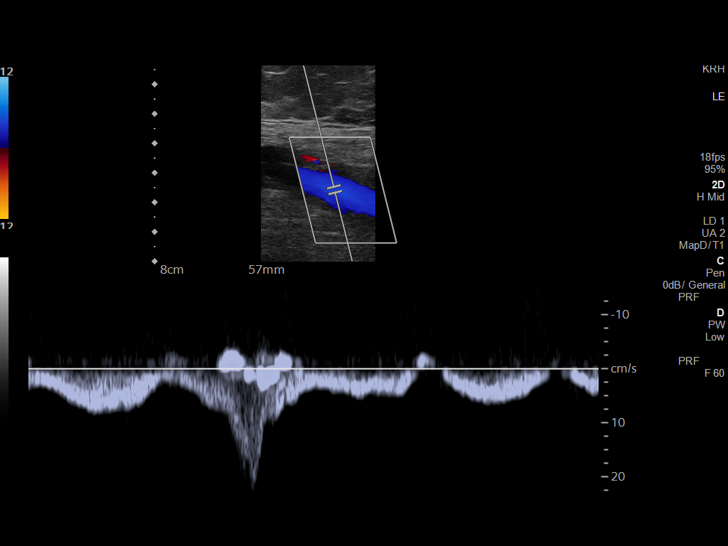
[im 17/47]
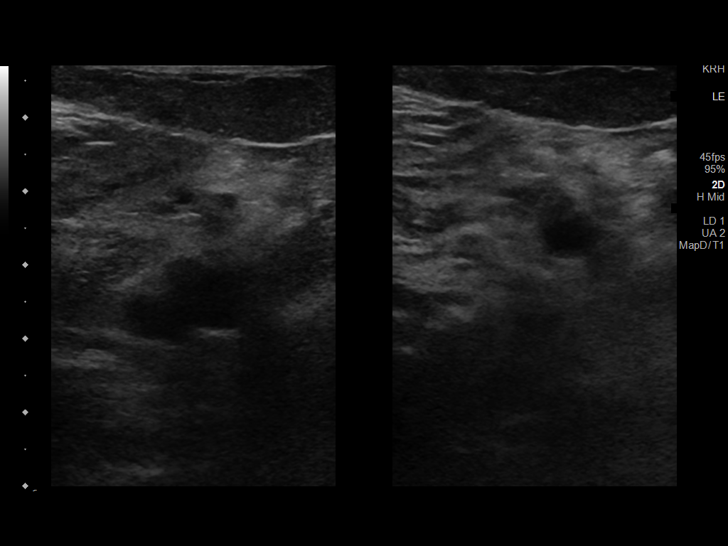
[im 21/47]
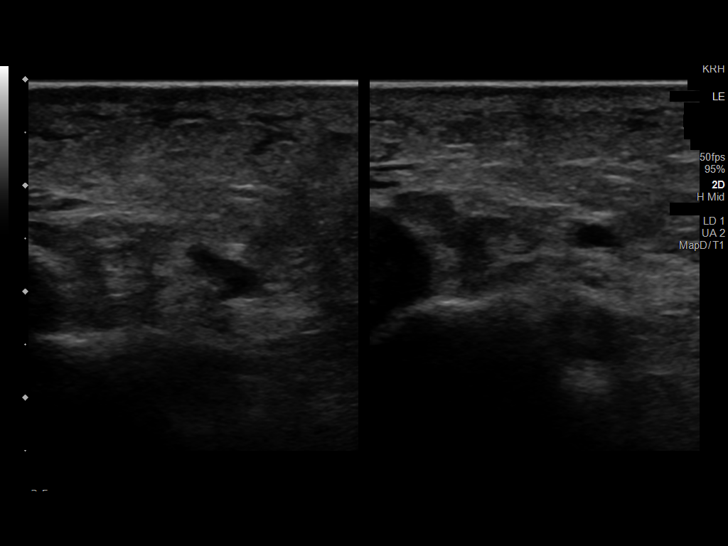
[im 25/47]
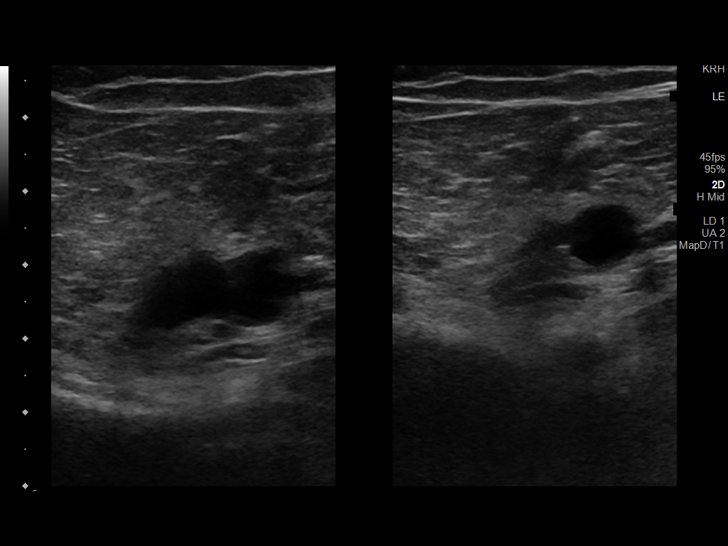
[im 27/47]
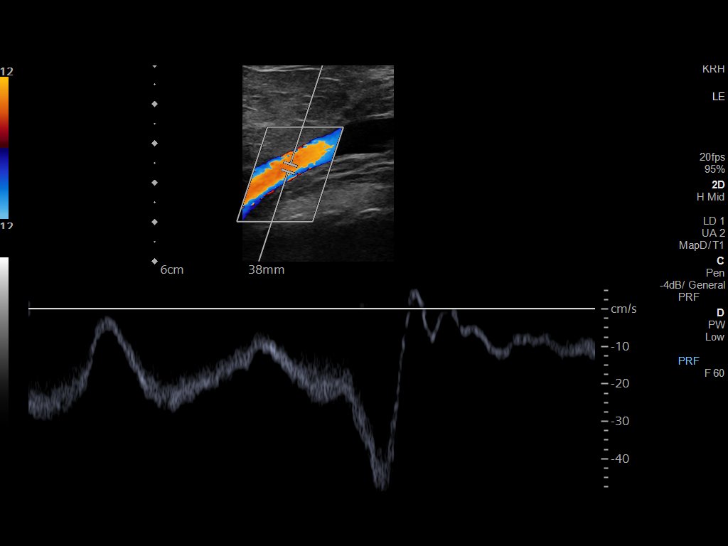
[im 31/47]
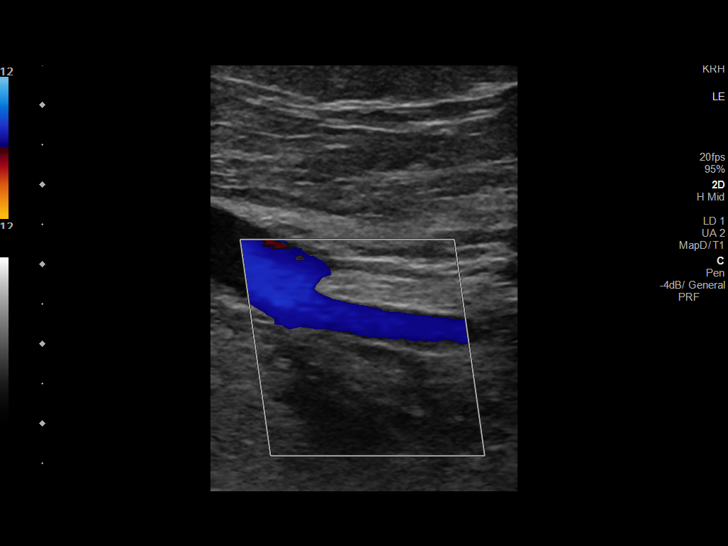
[im 35/47]
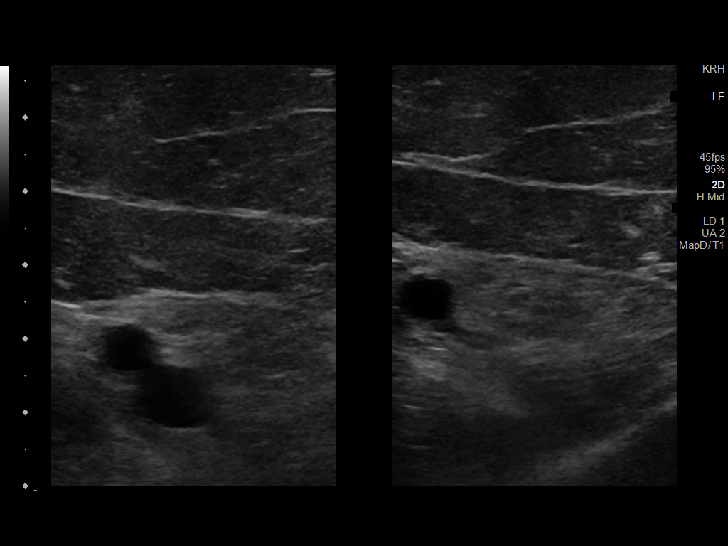
[im 39/47]
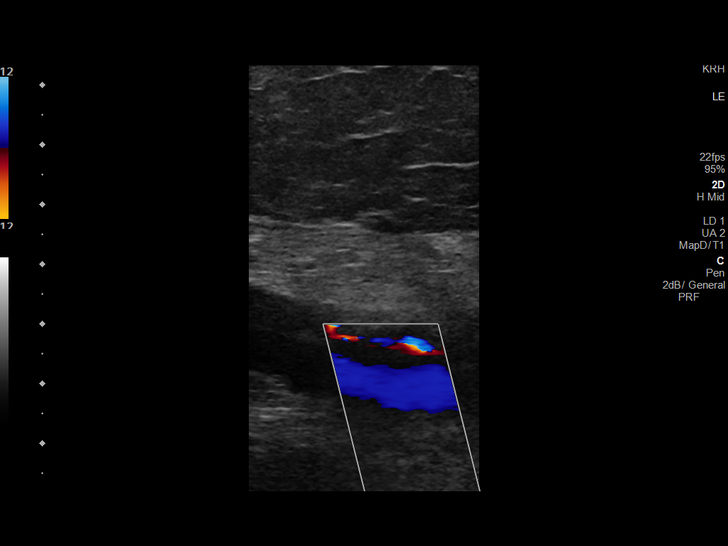
[im 43/47]
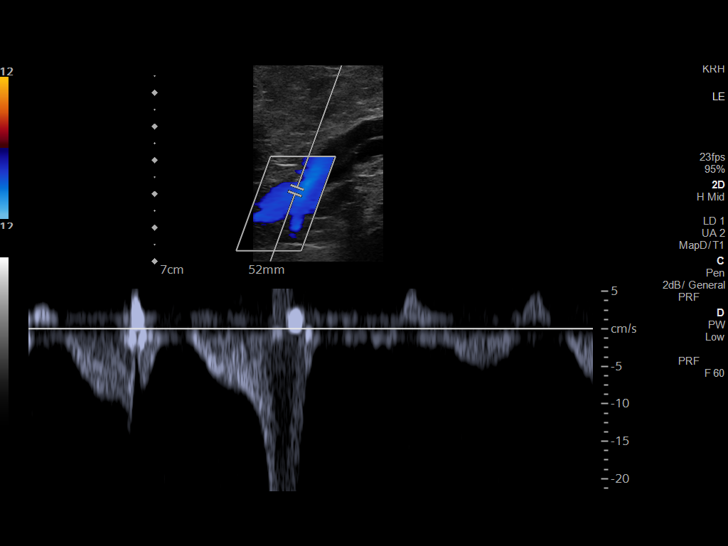
[im 47/47]
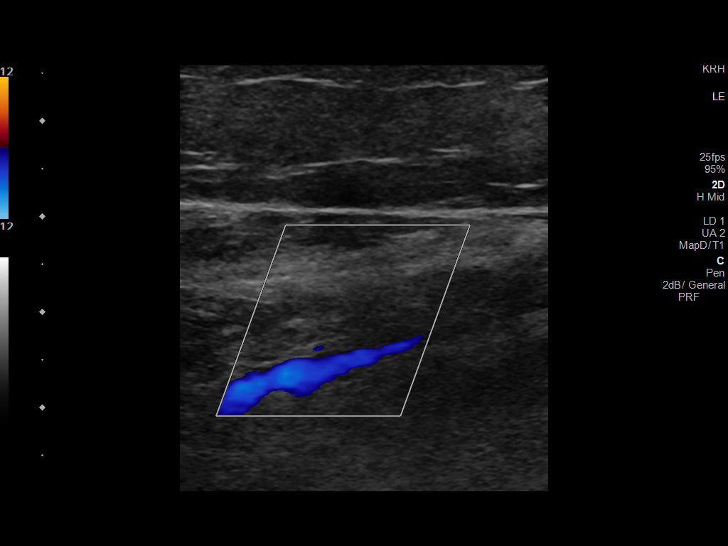

[13 of 24 positions shown; findings below may reference images not displayed]

FINDINGS: VENOUS

Normal compressibility of the common femoral, superficial femoral,
and popliteal veins. visualized portions of profunda femoral vein
and great saphenous vein unremarkable. The bilateral posterior
tibial and peroneal veins are not well visualized. No filling
defects to suggest DVT on grayscale or color Doppler imaging.
Doppler waveforms show normal direction of venous flow, normal
respiratory plasticity and response to augmentation.

OTHER

None.

Limitations: none
IMPRESSION: No femoropopliteal DVT nor evidence of DVT within the visualized
lower extremity veins. Note is made that the bilateral posterior
tibial and peroneal veins are not well visualized. If clinical
symptoms are inconsistent or if there are persistent or worsening
symptoms, further imaging (possibly involving the iliac veins) may
be warranted.

## 2022-07-19 IMAGING — CT CT ANGIO CHEST
2 of 8 series · 18 of 36 positions shown · IV contrast (omnipaque)
Comparison: Chest radiograph dated 02/08/2009.

CLINICAL DATA: Concern for pulmonary embolism.

EXAM:
CT ANGIOGRAPHY CHEST WITH CONTRAST
TECHNIQUE: Multidetector CT imaging of the chest was performed using the
standard protocol during bolus administration of intravenous
contrast. Multiplanar CT image reconstructions and MIPs were
obtained to evaluate the vascular anatomy.
CONTRAST:  80mL OMNIPAQUE IOHEXOL 350 MG/ML SOLN

[Series 6: pe thins · axial · 0.68mm/px · z∈[+984,+1218]mm · 17 of 263 slices shown]
[im 14/263  lung]
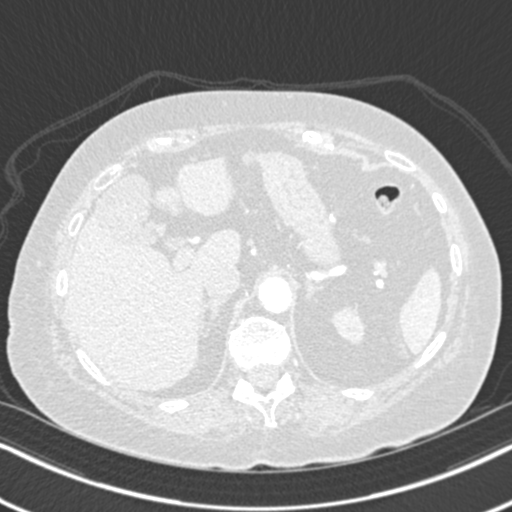
[im 28/263  mediastinal]
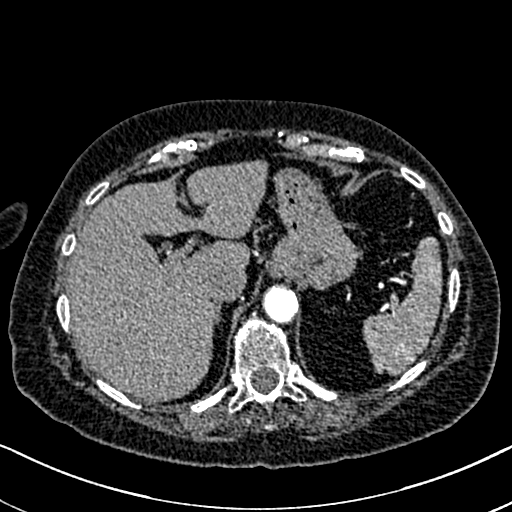
[im 42/263  lung]
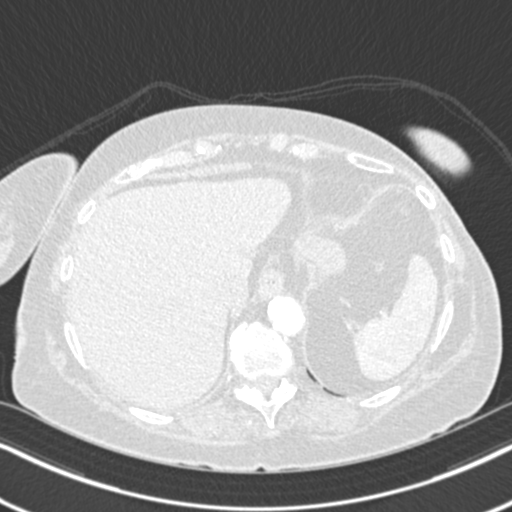
[im 56/263  mediastinal]
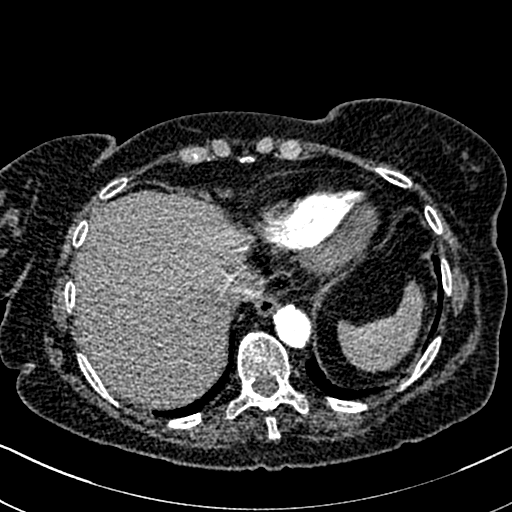
[im 69/263  lung]
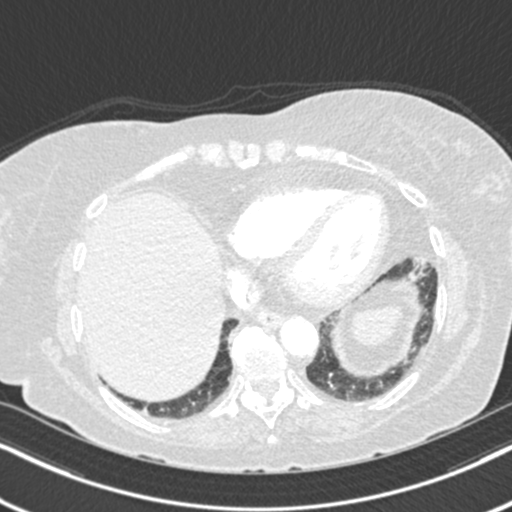
[im 83/263  mediastinal]
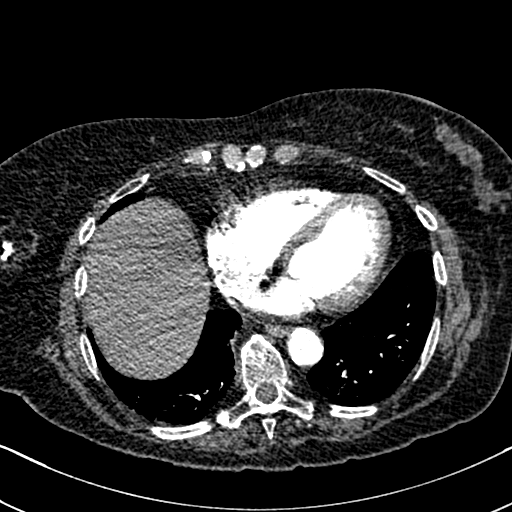
[im 97/263  lung]
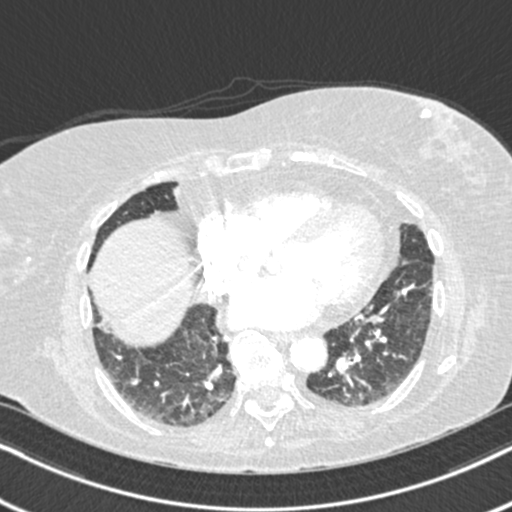
[im 111/263  mediastinal]
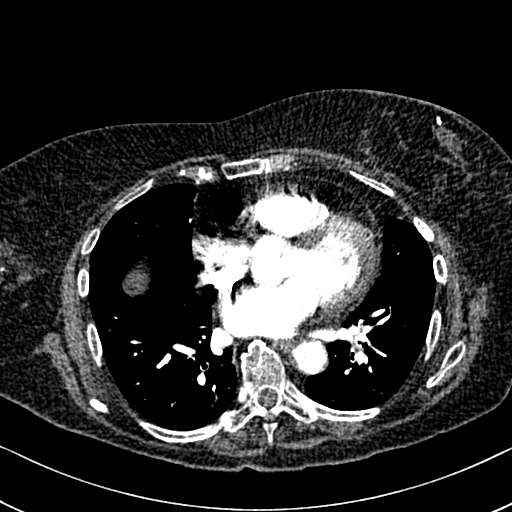
[im 138/263  lung]
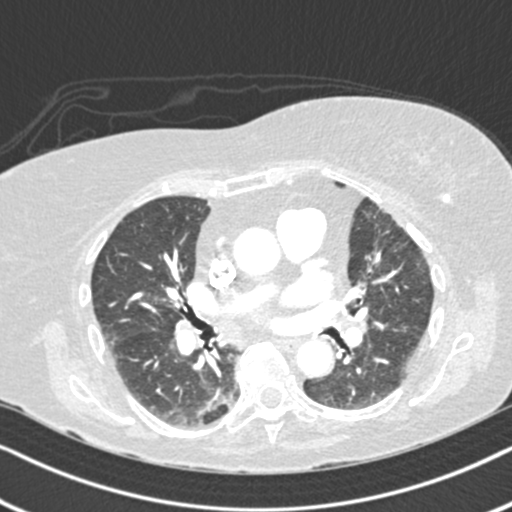
[im 152/263  mediastinal]
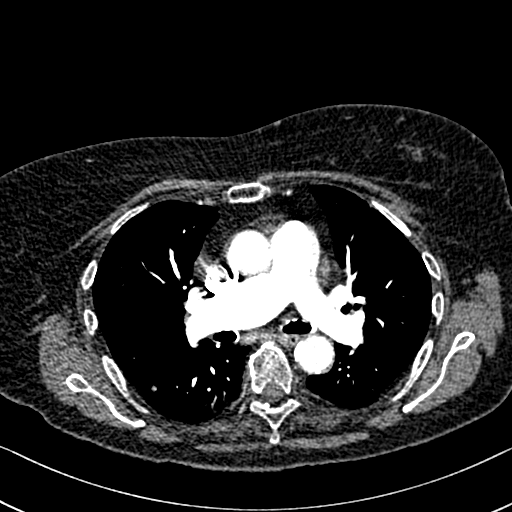
[im 166/263  lung]
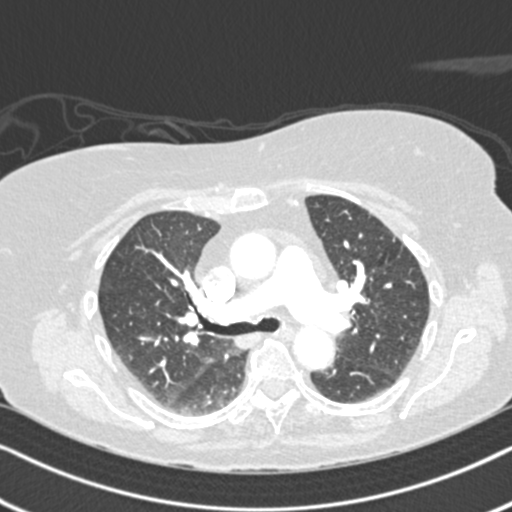
[im 180/263  mediastinal]
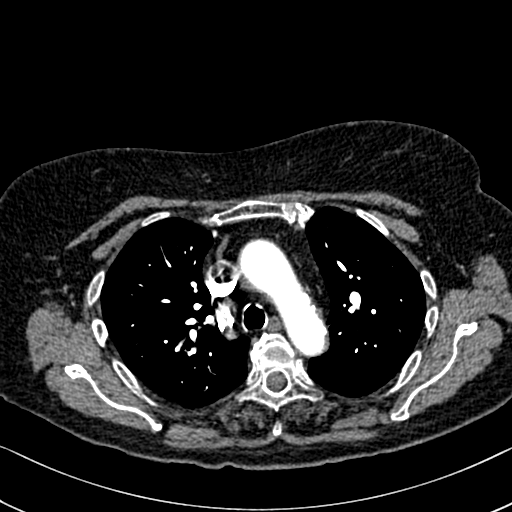
[im 194/263  lung]
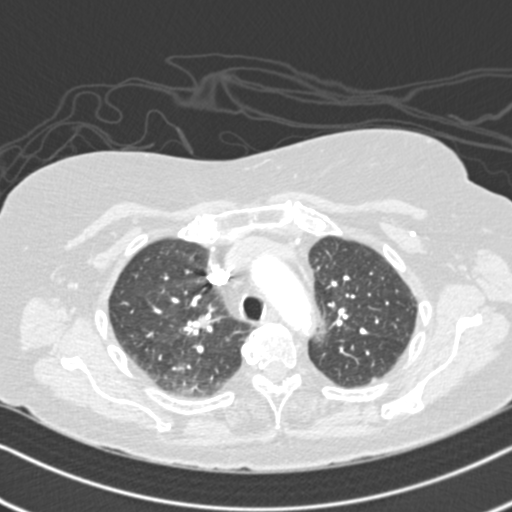
[im 207/263  mediastinal]
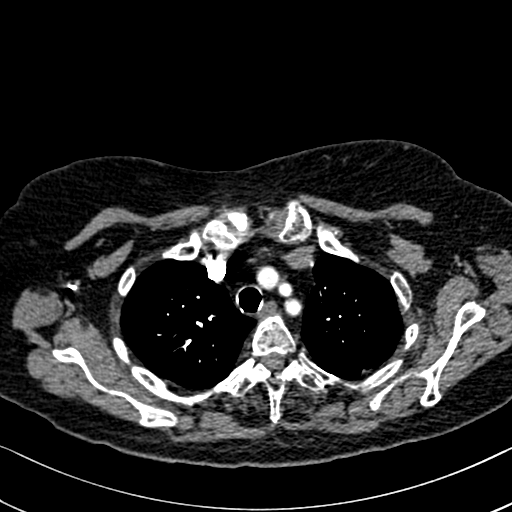
[im 221/263  lung]
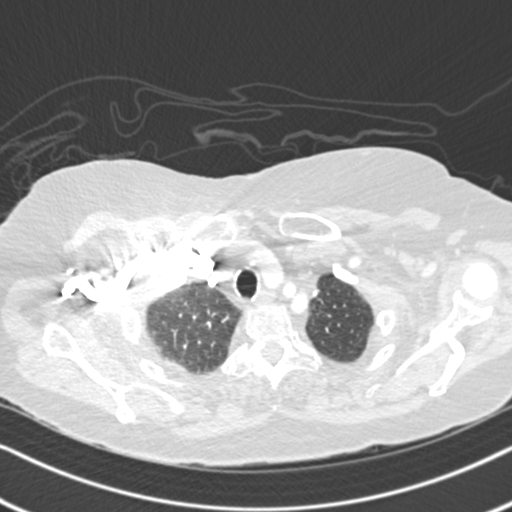
[im 235/263  mediastinal]
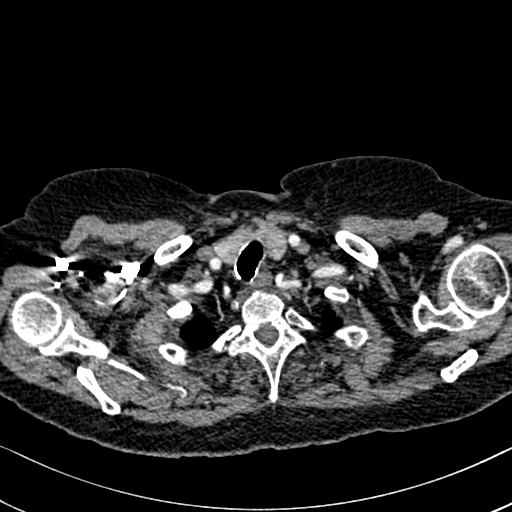
[im 249/263  lung]
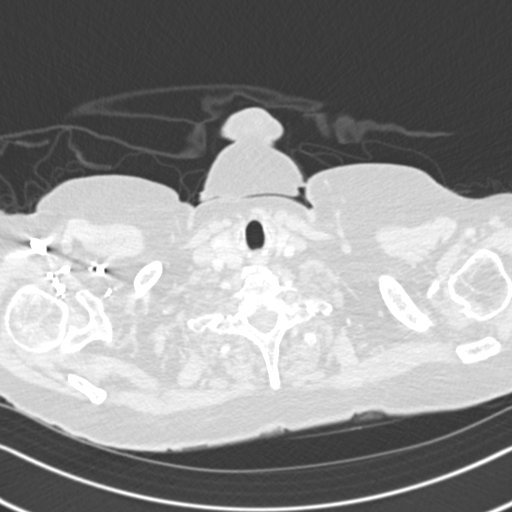

[Series 7: pe coronal mpr · coronal · 0.55mm/px · 1 of 136 slices shown]
[im 68/136  mediastinal]
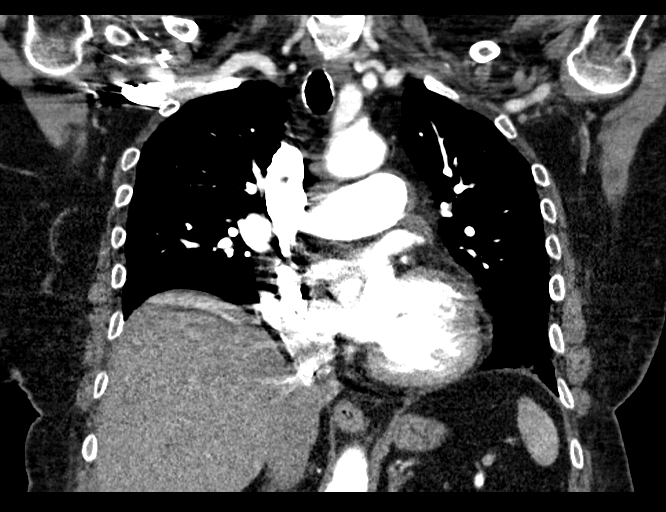

[18 of 36 positions shown; findings below may reference images not displayed]

FINDINGS: Cardiovascular: There is no cardiomegaly or pericardial effusion.
There is coronary vascular calcification. Moderate atherosclerotic
calcification of the thoracic aorta. No aneurysmal dilatation or
dissection. The origins of the great vessels of the aortic arch
appear patent as visualized. Evaluation of the pulmonary arteries is
somewhat limited due to respiratory motion. No pulmonary artery
embolus identified.

Mediastinum/Nodes: No hilar or mediastinal adenopathy. The esophagus
and the thyroid gland are grossly unremarkable as visualized. No
mediastinal fluid collection.

Lungs/Pleura: There is a 7 mm right upper lobe nodule (71/6). There
are bibasilar linear atelectasis/scarring. No focal consolidation,
pleural effusion, or pneumothorax. The central airways are patent.

Upper Abdomen: Gallstone.

Musculoskeletal: Osteopenia with degenerative changes of the spine.
No acute osseous pathology.

Review of the MIP images confirms the above findings.
IMPRESSION: 1. No acute intrathoracic pathology. No CT evidence of pulmonary
embolism.
2. A 7 mm right upper lobe nodule. Non-contrast chest CT at 6-12
months is recommended. If the nodule is stable at time of repeat CT,
then future CT at 18-24 months (from today's scan) is considered
optional for low-risk patients, but is recommended for high-risk
patients. This recommendation follows the consensus statement:
Guidelines for Management of Incidental Pulmonary Nodules Detected
3. Cholelithiasis.
4. Aortic Atherosclerosis (93XH2-J6X.X).

## 2023-04-26 ENCOUNTER — Encounter (HOSPITAL_BASED_OUTPATIENT_CLINIC_OR_DEPARTMENT_OTHER): Payer: Self-pay

## 2023-04-26 ENCOUNTER — Emergency Department (HOSPITAL_BASED_OUTPATIENT_CLINIC_OR_DEPARTMENT_OTHER)
Admission: EM | Admit: 2023-04-26 | Discharge: 2023-04-26 | Disposition: A | Discharge status: Home / Self Care | Payer: MEDICARE | Attending: Emergency Medicine | Admitting: Emergency Medicine

## 2023-04-26 ENCOUNTER — Other Ambulatory Visit: Payer: Self-pay

## 2023-04-26 DIAGNOSIS — S8991XA Unspecified injury of right lower leg, initial encounter: Secondary | ICD-10-CM | POA: Diagnosis present

## 2023-04-26 DIAGNOSIS — W208XXA Other cause of strike by thrown, projected or falling object, initial encounter: Secondary | ICD-10-CM | POA: Diagnosis not present

## 2023-04-26 DIAGNOSIS — S81811A Laceration without foreign body, right lower leg, initial encounter: Secondary | ICD-10-CM | POA: Insufficient documentation

## 2023-04-26 DIAGNOSIS — Z7901 Long term (current) use of anticoagulants: Secondary | ICD-10-CM | POA: Diagnosis not present

## 2023-04-26 DIAGNOSIS — Z23 Encounter for immunization: Secondary | ICD-10-CM | POA: Diagnosis not present

## 2023-04-26 MED ORDER — TETANUS-DIPHTH-ACELL PERTUSSIS 5-2.5-18.5 LF-MCG/0.5 IM SUSY
0.5000 mL | PREFILLED_SYRINGE | Freq: Once | INTRAMUSCULAR | Status: AC
  Administered 2023-04-26: 0.5 mL via INTRAMUSCULAR
  Filled 2023-04-26: qty 0.5

## 2023-04-26 NOTE — Discharge Instructions (Signed)
Please monitor for any signs of infection including worsening pain, redness, swelling, pus draining from the affected site.  Please return for antibiotics if you are concerned about developing infection.  Otherwise you can rinse the area gently with soap and water.  You do not need to keep it covered as the skin glue is a protective barrier on its own but you can keep a small bandage over it if you would like.  It is going to heal from the inside out slowly over time.  You may lose some of the overlying bruised and damaged skin, this is normal and okay

## 2023-04-26 NOTE — ED Notes (Addendum)
Wound to shin cleansed with NS. Will dress after assessed by EDP

## 2023-04-26 NOTE — ED Triage Notes (Signed)
She states she dropped a heavy pan lid onto her shin today. She has a dressing on it which is bleeding through slightly--same re-inforced at triage. She is ambulatory.

## 2023-04-26 NOTE — ED Provider Notes (Signed)
[place] EMERGENCY DEPARTMENT AT [facility] [place] Provider Note   CSN: [id number] Arrival date & time: 04/26/23  1746     History  No chief complaint on file.   Denise Salazar is a 82 y.o. 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  Patient reports that it took around 10 minutes to get the bleeding controlled.  She reports some tenderness around the site but has had no difficulty walking at this time.  She denies any other injuries.  HPI     Home Medications Prior to Admission medications   Medication Sig Start Date End Date Taking? Authorizing Provider  amLODipine (NORVASC) 2.5 MG tablet Take 1 tablet by mouth daily. 06/06/21   [provider]  bupivacaine (MARCAINE) 0.25 % injection Inject into the skin. 04/23/21   [provider]  calcium carbonate (OSCAL) 1500 (600 Ca) MG TABS tablet Take by mouth.    [provider]  denosumab (PROLIA) 60 MG/ML SOSY injection Inject into the skin. 01/03/20   [provider]  escitalopram (LEXAPRO) 10 MG tablet Take 1 tablet by mouth daily. 07/02/21 09/30/21  [provider]  furosemide (LASIX) 20 MG tablet Take by mouth. 03/26/20   [provider]  Multiple Vitamin (MULTI-VITAMIN) tablet Take 1 tablet by mouth daily.    [provider]  potassium chloride SA (KLOR-CON) 20 MEQ tablet TAKE 1 TABLET(20 MEQ) BY MOUTH DAILY 05/20/21   [provider]  pravastatin (PRAVACHOL) 10 MG tablet Take 10 mg by mouth at bedtime. 05/12/21   [provider]  Vitamin D, Ergocalciferol, (DRISDOL) 1.25 MG (50000 UNIT) CAPS capsule Take by mouth. 06/12/21   [provider]  warfarin (COUMADIN) 5 MG tablet TAKE 1 TABLET BY MOUTH  DAILY EXCEPT TAKE 1  AND 1/2 TABLETS ON FRIDAYS OR AS  DIRECTED 06/26/21   [provider]      Allergies    No allergies on file    Review of Systems   Review of Systems  Skin:  Positive for wound.  All other systems reviewed and are negative.   Physical Exam Updated Vital Signs BP 133/73 (BP Location: Right Arm)   Pulse 88   Temp 98 F (36.7 C) (Oral)   Resp 18   SpO2 98%  Physical Exam Vitals and nursing note reviewed.  Constitutional:      General: She is not in acute distress.    Appearance: Normal appearance.  HENT:     Head: Normocephalic and atraumatic.  Eyes:     General:        Right eye: No discharge.        Left eye: No discharge.  Cardiovascular:     Rate and Rhythm: Normal rate and regular rhythm.  Pulmonary:     Effort: Pulmonary effort is normal. No respiratory distress.  Musculoskeletal:        General: No deformity.  Skin:    General: Skin is warm and dry.     Comments: Patient with crescent-shaped skin tear versus very shallow laceration of the right shin just overlying the tibia, no foreign body noted, wound is clean, dry, not actively bleeding.  There is significant surrounding skin bruising.  Approximately 3 cm in length.  Neurological:     Mental Status: She is alert  and oriented to person, place, and time.  Psychiatric:        Mood and Affect: Mood normal.        Behavior: Behavior normal.     ED Results / Procedures / Treatments   Labs (all labs ordered are listed, but only abnormal results are displayed) Labs Reviewed - No data to display  EKG None  Radiology No results found.  Procedures .Marland KitchenLaceration Repair  Date/Time: 04/26/2023 8:58 PM  Performed by: Olene Floss, PA-C Authorized by: Olene Floss, PA-C   Consent:    Consent obtained:  Verbal   Consent given by:  Patient   Risks, benefits, and alternatives were discussed: yes     Risks discussed:  Infection, pain, poor cosmetic result and need for additional  repair   Alternatives discussed:  No treatment Universal protocol:    Procedure explained and questions answered to patient or proxy's satisfaction: yes     Patient identity confirmed:  Verbally with patient Anesthesia:    Anesthesia method:  None Laceration details:    Location:  Leg   Leg location:  R lower leg   Length (cm):  3.5   Depth (mm):  2 Treatment:    Area cleansed with:  Soap and water   Amount of cleaning:  Standard Skin repair:    Repair method:  Tissue adhesive and Steri-Strips Approximation:    Approximation:  Close Repair type:    Repair type:  Simple Post-procedure details:    Dressing:  Open (no dressing)   Procedure completion:  Tolerated     Medications Ordered in ED Medications  Tdap (BOOSTRIX) injection 0.5 mL (has no administration in time range)    ED Course/ Medical Decision Making/ A&P                             Medical Decision Making Risk Prescription drug management.   This is an overall well-appearing 82yo female who presents with a laceration /skin tear of the right lower extremity.  The wound was explored through full range of motion, there is no evidence of foreign body, fascia rupture, damage to the deep vessels, capillary refill is intact distal to the site of the laceration.  Patient is not up-to-date on their tetanus, tetanus updated today.  They are neurovascularly intact throughout on my exam, intact strength of the affected extremity.   Wound was extensively cleaned, and repaired as described above with tissue adhesive.  Patient tolerated the procedure without difficulty.    Patient instructed to monitor for signs of infection including worsening pain, redness, pus draining from the affected site.  Instructed to keep the wound clean, covered, and change the bandage at least once daily.  Patient understands and agrees to the plan, and is discharged in stable condition at this time.  Final Clinical Impression(s) / ED  Diagnoses Final diagnoses:  Skin tear of right lower leg without complication, initial encounter    Rx / DC Orders ED Discharge Orders     None         [place] 04/26/23 2100    Arby Barrette, MD 04/27/23 [phone]

## 2023-04-27 ENCOUNTER — Emergency Department (HOSPITAL_BASED_OUTPATIENT_CLINIC_OR_DEPARTMENT_OTHER): Payer: MEDICARE

## 2023-04-27 ENCOUNTER — Emergency Department (HOSPITAL_BASED_OUTPATIENT_CLINIC_OR_DEPARTMENT_OTHER)
Admission: EM | Admit: 2023-04-27 | Discharge: 2023-04-27 | Disposition: A | Discharge status: Home / Self Care | Payer: MEDICARE | Attending: Emergency Medicine | Admitting: Emergency Medicine

## 2023-04-27 ENCOUNTER — Encounter (HOSPITAL_BASED_OUTPATIENT_CLINIC_OR_DEPARTMENT_OTHER): Payer: Self-pay | Admitting: Emergency Medicine

## 2023-04-27 DIAGNOSIS — M549 Dorsalgia, unspecified: Secondary | ICD-10-CM | POA: Insufficient documentation

## 2023-04-27 DIAGNOSIS — M25552 Pain in left hip: Secondary | ICD-10-CM | POA: Diagnosis not present

## 2023-04-27 DIAGNOSIS — Z86711 Personal history of pulmonary embolism: Secondary | ICD-10-CM | POA: Insufficient documentation

## 2023-04-27 DIAGNOSIS — S5001XA Contusion of right elbow, initial encounter: Secondary | ICD-10-CM

## 2023-04-27 DIAGNOSIS — W01198A Fall on same level from slipping, tripping and stumbling with subsequent striking against other object, initial encounter: Secondary | ICD-10-CM | POA: Diagnosis not present

## 2023-04-27 DIAGNOSIS — I10 Essential (primary) hypertension: Secondary | ICD-10-CM | POA: Diagnosis not present

## 2023-04-27 DIAGNOSIS — W19XXXA Unspecified fall, initial encounter: Secondary | ICD-10-CM

## 2023-04-27 DIAGNOSIS — Z853 Personal history of malignant neoplasm of breast: Secondary | ICD-10-CM | POA: Diagnosis not present

## 2023-04-27 DIAGNOSIS — E119 Type 2 diabetes mellitus without complications: Secondary | ICD-10-CM | POA: Insufficient documentation

## 2023-04-27 DIAGNOSIS — S0990XA Unspecified injury of head, initial encounter: Secondary | ICD-10-CM | POA: Insufficient documentation

## 2023-04-27 DIAGNOSIS — S40011A Contusion of right shoulder, initial encounter: Secondary | ICD-10-CM | POA: Diagnosis not present

## 2023-04-27 DIAGNOSIS — Z7901 Long term (current) use of anticoagulants: Secondary | ICD-10-CM | POA: Insufficient documentation

## 2023-04-27 DIAGNOSIS — S4991XA Unspecified injury of right shoulder and upper arm, initial encounter: Secondary | ICD-10-CM | POA: Diagnosis present

## 2023-04-27 DIAGNOSIS — S80811A Abrasion, right lower leg, initial encounter: Secondary | ICD-10-CM | POA: Diagnosis not present

## 2023-04-27 DIAGNOSIS — M25551 Pain in right hip: Secondary | ICD-10-CM | POA: Diagnosis not present

## 2023-04-27 DIAGNOSIS — R531 Weakness: Secondary | ICD-10-CM | POA: Diagnosis not present

## 2023-04-27 DIAGNOSIS — Z79899 Other long term (current) drug therapy: Secondary | ICD-10-CM | POA: Insufficient documentation

## 2023-04-27 LAB — BASIC METABOLIC PANEL
Anion gap: 11 (ref 5–15)
BUN: 28 mg/dL — ABNORMAL HIGH (ref 8–23)
CO2: 27 mmol/L (ref 22–32)
Calcium: 9 mg/dL (ref 8.9–10.3)
Chloride: 101 mmol/L (ref 98–111)
Creatinine, Ser: 1.42 mg/dL — ABNORMAL HIGH (ref 0.44–1.00)
GFR, Estimated: 37 mL/min — ABNORMAL LOW (ref >60)
Glucose, Bld: 192 mg/dL — ABNORMAL HIGH (ref 70–99)
Potassium: 4 mmol/L (ref 3.5–5.1)
Sodium: 139 mmol/L (ref 135–145)

## 2023-04-27 LAB — CBC
HCT: 40.6 % (ref 36.0–46.0)
Hemoglobin: 13.3 g/dL (ref 12.0–15.0)
MCH: 29 pg (ref 26.0–34.0)
MCHC: 32.8 g/dL (ref 30.0–36.0)
MCV: 88.6 fL (ref 80.0–100.0)
Platelets: 224 10*3/uL (ref 150–400)
RBC: 4.58 MIL/uL (ref 3.87–5.11)
RDW: 13.5 % (ref 11.5–15.5)
WBC: 8.4 10*3/uL (ref 4.0–10.5)
nRBC: 0 % (ref 0.0–0.2)

## 2023-04-27 LAB — PROTIME-INR
INR: 2.5 — ABNORMAL HIGH (ref 0.8–1.2)
Prothrombin Time: 27 seconds — ABNORMAL HIGH (ref 11.4–15.2)

## 2023-04-27 NOTE — ED Triage Notes (Signed)
Pt fell around midnight, hit right rear of head, pt is on coumadin. Pain in both hips.

## 2023-04-27 NOTE — ED Notes (Signed)
Patient in xray 

## 2023-04-27 NOTE — Discharge Instructions (Addendum)
Please use Tylenol for pain.  You may use 1000 mg of Tylenol every 6 hours.  Not to exceed 4 g of Tylenol within 24 hours.  

## 2023-04-27 NOTE — ED Provider Notes (Signed)
[facility] Provider Note   CSN: [id number] Arrival date & time: 04/27/23  1931     History  Chief Complaint  Patient presents with   Head Injury   Hip Pain    ALEXES MENCHACA is a 82 y.o. 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  She denies any feeling of heart racing, chest pain, shortness of breath, denies any previous episodes of falling like this but reports that she has some chronic weakness of her legs secondary to chronic back pain.   Head Injury Hip Pain       Home Medications Prior to Admission medications   Medication Sig Start Date End Date Taking? Authorizing Provider  amLODipine (NORVASC) 2.5 MG tablet Take 1 tablet by mouth daily. 06/06/21   [provider]  bupivacaine (MARCAINE) 0.25 % injection Inject into the skin. 04/23/21   [provider]  calcium carbonate (OSCAL) 1500 (600 Ca) MG TABS tablet Take by mouth.    [provider]  denosumab (PROLIA) 60 MG/ML SOSY injection Inject into the skin. 01/03/20   [provider]  escitalopram (LEXAPRO) 10 MG tablet Take 1 tablet by mouth daily. 07/02/21 09/30/21  [provider]  furosemide (LASIX) 20 MG tablet Take by mouth. 03/26/20   [provider]  Multiple Vitamin (MULTI-VITAMIN) tablet Take 1 tablet by mouth daily.    [provider]  potassium chloride SA (KLOR-CON) 20 MEQ tablet TAKE 1 TABLET(20 MEQ) BY MOUTH DAILY 05/20/21   [provider]  pravastatin (PRAVACHOL) 10 MG tablet Take 10 mg by mouth at bedtime. 05/12/21   [provider]  Vitamin D, Ergocalciferol, (DRISDOL) 1.25 MG (50000 UNIT) CAPS capsule Take by mouth. 06/12/21   [provider]   warfarin (COUMADIN) 5 MG tablet TAKE 1 TABLET BY MOUTH  DAILY EXCEPT TAKE 1 AND 1/2 TABLETS ON FRIDAYS OR AS  DIRECTED 06/26/21   [provider]      Allergies    No allergies on file    Review of Systems   Review of Systems  All other systems reviewed and are negative.   Physical Exam Updated Vital Signs BP (!) 166/71   Pulse 72   Temp 98.1 F (36.7 C) (Oral)   Resp 19   Ht 5\' 4"  (1.626 m)   Wt 86.2 kg   SpO2 94%   BMI 32.62 kg/m  Physical Exam Vitals and nursing note reviewed.  Constitutional:      General: She is not in acute distress.    Appearance: Normal appearance.  HENT:     Head: Normocephalic and atraumatic.  Eyes:     General:        Right eye: No discharge.        Left eye: No discharge.  Cardiovascular:     Rate and Rhythm: Normal rate and regular rhythm.     Heart sounds: No murmur heard.    No friction rub. No gallop.  Pulmonary:     Effort: Pulmonary effort is normal.     Breath sounds: Normal breath sounds.  Abdominal:     General: Bowel sounds are normal.     Palpations: Abdomen is soft.  Musculoskeletal:     Comments: Patient with some  bruising of right elbow, right shoulder, she has some tenderness of bilateral hips without step-off or deformity.  She has intact strength 5/5 bilateral lower extremities.  She ambulate without significant difficulty.  Normal crossarm adduction of the affected right shoulder.  Skin:    General: Skin is warm and dry.     Capillary Refill: Capillary refill takes less than 2 seconds.     Comments: Appropriately healing abrasion of right lower extremity from yesterday's emergency department visit  Neurological:     Mental Status: She is alert and oriented to person, place, and time.     Comments: Cranial nerves II through XII grossly intact.  Intact finger-nose, intact heel-to-shin.  Romberg negative, gait normal.  Alert and oriented x3.  Moves all 4 limbs spontaneously, normal coordination.  No pronator  drift.  Intact strength 5 out of 5 bilateral upper and lower extremities.  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  Psychiatric:        Mood and Affect: Mood normal.        Behavior: Behavior normal.     ED Results / Procedures / Treatments   Labs (all labs ordered are listed, but only abnormal results are displayed) Labs Reviewed  BASIC METABOLIC PANEL - Abnormal; Notable for the following components:      Result Value   Glucose, Bld 192 (*)    BUN 28 (*)    Creatinine, Ser 1.42 (*)    GFR, Estimated 37 (*)    All other components within normal limits  PROTIME-INR - Abnormal; Notable for the following components:   Prothrombin Time 27.0 (*)    INR 2.5 (*)    All other components within normal limits  CBC    EKG EKG Interpretation  Date/Time:  Monday April 27 2023 19:52:45 EDT Ventricular Rate:  77 PR Interval:  158 QRS Duration: 96 QT Interval:  421 QTC Calculation: 477 R Axis:   51 Text Interpretation: Sinus rhythm Confirmed by Vonita Moss [phone]) on 04/27/2023 9:16:39 PM  Radiology DG HIPS BILAT WITH PELVIS MIN 5 VIEWS  Result Date: 04/27/2023 CLINICAL DATA:  [id number] Fall [id number] EXAM: DG HIP (WITH OR WITHOUT PELVIS) 5+V BILAT COMPARISON:  April 09, 2022 April 08, 2022. FINDINGS: No acute fracture or dislocation. Joint spaces are relatively maintained bilateral hips with minimal osteophyte formation. Postsurgical changes of the lower lumbar spine. Sacrum is obscured by overlapping bowel contents. No area of erosion or osseous destruction. No unexpected radiopaque foreign body. Soft tissues are unremarkable. IMPRESSION: No acute fracture or dislocation. If there is a persistent clinical concern for nondisplaced hip or pelvic fracture, recommend dedicated pelvic CT or MRI. Electronically Signed   By: Meda Klinefelter M.D.   On: 04/27/2023 20:53   DG  Tibia/Fibula Right  Result Date: 04/27/2023 CLINICAL DATA:  fall EXAM: RIGHT TIBIA AND FIBULA - 2 VIEW COMPARISON:  None Available. FINDINGS: Osteopenia. No acute fracture or dislocation. Tricompartmental degenerative changes most pronounced in the medial compartment. No area of erosion or osseous destruction. No unexpected radiopaque foreign body. Vascular calcifications. IMPRESSION: No acute fracture or dislocation. Electronically Signed   By: Meda Klinefelter M.D.   On: 04/27/2023 20:51   DG Elbow Complete Right  Result Date: 04/27/2023 CLINICAL DATA:  fall EXAM: RIGHT ELBOW - COMPLETE 3+ VIEW COMPARISON:  None Available. FINDINGS: Osteopenia. No acute fracture or dislocation. Degenerative changes of the ulnohumeral joint. No area of  erosion or osseous destruction. No unexpected radiopaque foreign body. Soft tissues are unremarkable. IMPRESSION: No acute fracture or dislocation. Electronically Signed   By: Meda Klinefelter M.D.   On: 04/27/2023 20:50   DG Shoulder Right  Result Date: 04/27/2023 CLINICAL DATA:  fall EXAM: RIGHT SHOULDER - 2+ VIEW COMPARISON:  February 08, 2009 chest radiograph FINDINGS: Osteopenia. No acute fracture or dislocation. Mild degenerative changes of the acromioclavicular joint. No area of erosion or osseous destruction. No unexpected radiopaque foreign body. Soft tissues are unremarkable. IMPRESSION: 1. No acute fracture or dislocation. Electronically Signed   By: Meda Klinefelter M.D.   On: 04/27/2023 20:49   CT Head Wo Contrast  Result Date: 04/27/2023 CLINICAL DATA:  Neck trauma (Age >= 65y); Head trauma, moderate-severe EXAM: CT HEAD WITHOUT CONTRAST CT CERVICAL SPINE WITHOUT CONTRAST TECHNIQUE: Multidetector CT imaging of the head and cervical spine was performed following the standard protocol without intravenous contrast. Multiplanar CT image reconstructions of the cervical spine were also generated. RADIATION DOSE REDUCTION: This exam was performed according to  the departmental dose-optimization program which includes automated exposure control, adjustment of the mA and/or kV according to patient size and/or use of iterative reconstruction technique. COMPARISON:  None Available. FINDINGS: CT HEAD FINDINGS Brain: Patchy and confluent areas of decreased attenuation are noted throughout the deep and periventricular white matter of the cerebral hemispheres bilaterally, compatible with chronic microvascular ischemic disease. No evidence of large-territorial acute infarction. No parenchymal hemorrhage. No mass lesion. No extra-axial collection. No mass effect or midline shift. No hydrocephalus. Basilar cisterns are patent. Vascular: No hyperdense vessel. Atherosclerotic calcifications are present within the cavernous internal carotid arteries. Skull: No acute fracture or focal lesion. Sinuses/Orbits: Paranasal sinuses and mastoid air cells are clear. Right orbital prosthesis. The orbits are unremarkable. Other: None. CT CERVICAL SPINE FINDINGS Alignment: Normal. Skull base and vertebrae: Moderate multilevel degenerative change of the spine with associated multilevel severe osseous neural foraminal stenosis. No severe osseous central canal stenosis. No acute fracture. No aggressive appearing focal osseous lesion or focal pathologic process. Soft tissues and spinal canal: No prevertebral fluid or swelling. No visible canal hematoma. Upper chest: Unremarkable. Other: None. IMPRESSION: 1. No acute intracranial abnormality. 2. No acute displaced fracture or traumatic listhesis of the cervical spine. 3. Moderate multilevel degenerative change of the spine with associated multilevel severe osseous neural foraminal stenosis. Electronically Signed   By: Tish Frederickson M.D.   On: 04/27/2023 20:32   CT Cervical Spine Wo Contrast  Result Date: 04/27/2023 CLINICAL DATA:  Neck trauma (Age >= 65y); Head trauma, moderate-severe EXAM: CT HEAD WITHOUT CONTRAST CT CERVICAL SPINE WITHOUT  CONTRAST TECHNIQUE: Multidetector CT imaging of the head and cervical spine was performed following the standard protocol without intravenous contrast. Multiplanar CT image reconstructions of the cervical spine were also generated. RADIATION DOSE REDUCTION: This exam was performed according to the departmental dose-optimization program which includes automated exposure control, adjustment of the mA and/or kV according to patient size and/or use of iterative reconstruction technique. COMPARISON:  None Available. FINDINGS: CT HEAD FINDINGS Brain: Patchy and confluent areas of decreased attenuation are noted throughout the deep and periventricular white matter of the cerebral hemispheres bilaterally, compatible with chronic microvascular ischemic disease. No evidence of large-territorial acute infarction. No parenchymal hemorrhage. No mass lesion. No extra-axial collection. No mass effect or midline shift. No hydrocephalus. Basilar cisterns are patent. Vascular: No hyperdense vessel. Atherosclerotic calcifications are present within the cavernous internal carotid arteries. Skull: No acute fracture or focal lesion.  Sinuses/Orbits: Paranasal sinuses and mastoid air cells are clear. Right orbital prosthesis. The orbits are unremarkable. Other: None. CT CERVICAL SPINE FINDINGS Alignment: Normal. Skull base and vertebrae: Moderate multilevel degenerative change of the spine with associated multilevel severe osseous neural foraminal stenosis. No severe osseous central canal stenosis. No acute fracture. No aggressive appearing focal osseous lesion or focal pathologic process. Soft tissues and spinal canal: No prevertebral fluid or swelling. No visible canal hematoma. Upper chest: Unremarkable. Other: None. IMPRESSION: 1. No acute intracranial abnormality. 2. No acute displaced fracture or traumatic listhesis of the cervical spine. 3. Moderate multilevel degenerative change of the spine with associated multilevel severe  osseous neural foraminal stenosis. Electronically Signed   By: Tish Frederickson M.D.   On: 04/27/2023 20:32    Procedures Procedures    Medications Ordered in ED Medications - No data to display  ED Course/ Medical Decision Making/ A&P                             Medical Decision Making Amount and/or Complexity of Data Reviewed Labs: ordered. Radiology: ordered.   This patient is a 82 y.o. female  who presents to the ED for concern of fall, head injury, right shoulder pain, right elbow pain, bilateral hip pain.   Differential diagnoses prior to evaluation: The emergent differential diagnosis includes, but is not limited to, cardiogenic syncope, neurogenic syncope, versus simple mechanical fall, worried for CVA, skull fracture, intracranial hemorrhage, acute fracture, dislocation, versus simple contusion, muscular soreness, versus other. This is not an exhaustive differential.   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 The pertinent findings include: Patient with some bruising of right elbow, right shoulder, she has some tenderness of bilateral hips without step-off or deformity.  She has intact strength 5/5 bilateral lower extremities.  She ambulate without significant difficulty.  Normal crossarm adduction of the affected right shoulder.    Appropriately healing abrasion of right lower extremity from yesterday's emergency department visit   Cranial nerves II through XII grossly intact.  Intact finger-nose, intact heel-to-shin.  Romberg negative, gait normal.  Alert and oriented x3.  Moves all 4 limbs spontaneously, normal coordination.  No pronator drift.  Intact strength 5 out of 5 bilateral upper and lower extremities.  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   Patient does have some hypertension in the emergency department, BP max 169/78, she reports that she is taking her blood pressure medication as prescribed, may be secondary to the pain that she is experiencing.   Lab Tests/Imaging studies: I personally interpreted labs/imaging and the pertinent results include: INR is in therapeutic range for warfarin, 2.5 today.  CBC unremarkable, BMP with elevation of creatinine and BUN from baseline on our records, but no baseline within the last 10 years, review of outside records shows overall similar compared to recent baseline, creatinine 1.25 on 04/06/2023.  I independently interpreted plain film imaging of bilateral hips, right tibia/fibula, right elbow, right shoulder, as well as CT of the head and C-spine which shows no evidence of acute fracture, dislocation, intracranial bleed, unstable cervical spine fracture.  She has some age-related degenerative changes in the cervical spine as well as microvascular changes that are age-related in the brain.  I agree with the radiologist interpretation.  Cardiac monitoring: EKG obtained and interpreted by myself and attending physician which shows: Normal sinus rhythm   Medications: Offered Tylenol for pain, body aches, patient declines at this time and reports she will take Tylenol at home.   Disposition: After consideration of the diagnostic results and the patients response to treatment, I feel that is stable for discharge with plan as above.   emergency department workup does not suggest an emergent condition requiring admission or immediate intervention beyond what has been performed at this time. The plan is: as above. The patient is safe for discharge and has been instructed to return immediately for worsening symptoms, change in symptoms or any other concerns.  Final Clinical Impression(s) / ED Diagnoses Final diagnoses:  Fall, initial  encounter  Bilateral hip pain  Contusion of right elbow, initial encounter  Contusion of right shoulder, initial encounter    Rx / DC Orders ED Discharge Orders     None         [place] 04/27/23 2148    Rondel Baton, MD 04/27/23 2351
